# Patient Record
Sex: Male | Born: 1950 | Race: White | Hispanic: No | Marital: Married | Smoking: Former smoker
Health system: Southern US, Community
[De-identification: ages and names within clinical notes are randomized; demographics above are authoritative.]

## PROBLEM LIST (undated history)

## (undated) DIAGNOSIS — I2510 Atherosclerotic heart disease of native coronary artery without angina pectoris: Secondary | ICD-10-CM

## (undated) DIAGNOSIS — Z720 Tobacco use: Secondary | ICD-10-CM

## (undated) DIAGNOSIS — I213 ST elevation (STEMI) myocardial infarction of unspecified site: Secondary | ICD-10-CM

## (undated) DIAGNOSIS — E785 Hyperlipidemia, unspecified: Secondary | ICD-10-CM

## (undated) DIAGNOSIS — I10 Essential (primary) hypertension: Secondary | ICD-10-CM

---

## 2013-08-01 HISTORY — PX: CORONARY ANGIOPLASTY WITH STENT PLACEMENT: SHX49

## 2013-08-02 ENCOUNTER — Encounter (HOSPITAL_COMMUNITY)
Admission: EM | Disposition: A | Discharge status: Home / Self Care | Payer: BC Managed Care – PPO | Source: Home / Self Care | Attending: Cardiovascular Disease

## 2013-08-02 ENCOUNTER — Inpatient Hospital Stay (HOSPITAL_COMMUNITY)
Admission: EM | Admit: 2013-08-02 | Discharge: 2013-08-04 | DRG: 247 | Disposition: A | Discharge status: Home / Self Care | Payer: BC Managed Care – PPO | Attending: Cardiovascular Disease | Admitting: Cardiovascular Disease

## 2013-08-02 ENCOUNTER — Ambulatory Visit (HOSPITAL_COMMUNITY): Admit: 2013-08-02 | Payer: Self-pay | Admitting: Cardiovascular Disease

## 2013-08-02 DIAGNOSIS — I4589 Other specified conduction disorders: Secondary | ICD-10-CM | POA: Diagnosis not present

## 2013-08-02 DIAGNOSIS — I2510 Atherosclerotic heart disease of native coronary artery without angina pectoris: Secondary | ICD-10-CM | POA: Diagnosis present

## 2013-08-02 DIAGNOSIS — I213 ST elevation (STEMI) myocardial infarction of unspecified site: Secondary | ICD-10-CM

## 2013-08-02 DIAGNOSIS — I498 Other specified cardiac arrhythmias: Secondary | ICD-10-CM | POA: Diagnosis not present

## 2013-08-02 DIAGNOSIS — F17200 Nicotine dependence, unspecified, uncomplicated: Secondary | ICD-10-CM | POA: Diagnosis present

## 2013-08-02 DIAGNOSIS — Z720 Tobacco use: Secondary | ICD-10-CM | POA: Diagnosis present

## 2013-08-02 DIAGNOSIS — I440 Atrioventricular block, first degree: Secondary | ICD-10-CM | POA: Diagnosis not present

## 2013-08-02 DIAGNOSIS — I4949 Other premature depolarization: Secondary | ICD-10-CM | POA: Diagnosis not present

## 2013-08-02 DIAGNOSIS — I2119 ST elevation (STEMI) myocardial infarction involving other coronary artery of inferior wall: Principal | ICD-10-CM | POA: Diagnosis present

## 2013-08-02 DIAGNOSIS — E785 Hyperlipidemia, unspecified: Secondary | ICD-10-CM | POA: Diagnosis present

## 2013-08-02 HISTORY — DX: ST elevation (STEMI) myocardial infarction of unspecified site: I21.3

## 2013-08-02 HISTORY — PX: PERCUTANEOUS CORONARY STENT INTERVENTION (PCI-S): SHX5485

## 2013-08-02 HISTORY — DX: Hyperlipidemia, unspecified: E78.5

## 2013-08-02 HISTORY — DX: Tobacco use: Z72.0

## 2013-08-02 HISTORY — DX: Atherosclerotic heart disease of native coronary artery without angina pectoris: I25.10

## 2013-08-02 HISTORY — PX: LEFT HEART CATHETERIZATION WITH CORONARY ANGIOGRAM: SHX5451

## 2013-08-02 LAB — CBC
HEMATOCRIT: 39.5 % (ref 39.0–52.0)
Hemoglobin: 14 g/dL (ref 13.0–17.0)
MCH: 32.2 pg (ref 26.0–34.0)
MCHC: 35.4 g/dL (ref 30.0–36.0)
MCV: 90.8 fL (ref 78.0–100.0)
Platelets: 206 10*3/uL (ref 150–400)
RBC: 4.35 MIL/uL (ref 4.22–5.81)
RDW: 13.3 % (ref 11.5–15.5)
WBC: 12.3 10*3/uL — AB (ref 4.0–10.5)

## 2013-08-02 LAB — COMPREHENSIVE METABOLIC PANEL
ALT: 17 U/L (ref 0–53)
AST: 25 U/L (ref 0–37)
Albumin: 3.7 g/dL (ref 3.5–5.2)
Alkaline Phosphatase: 59 U/L (ref 39–117)
BILIRUBIN TOTAL: 0.3 mg/dL (ref 0.3–1.2)
BUN: 11 mg/dL (ref 6–23)
CHLORIDE: 100 meq/L (ref 96–112)
CO2: 26 mEq/L (ref 19–32)
CREATININE: 0.84 mg/dL (ref 0.50–1.35)
Calcium: 9.1 mg/dL (ref 8.4–10.5)
GFR calc non Af Amer: >90 mL/min (ref >90)
Glucose, Bld: 127 mg/dL — ABNORMAL HIGH (ref 70–99)
Potassium: 3.9 mEq/L (ref 3.7–5.3)
Sodium: 137 mEq/L (ref 137–147)
Total Protein: 6.8 g/dL (ref 6.0–8.3)

## 2013-08-02 LAB — APTT: aPTT: 28 seconds (ref 24–37)

## 2013-08-02 LAB — PROTIME-INR
INR: 1.04 (ref 0.00–1.49)
Prothrombin Time: 13.4 seconds (ref 11.6–15.2)

## 2013-08-02 LAB — I-STAT TROPONIN, ED: TROPONIN I, POC: 0 ng/mL (ref 0.00–0.08)

## 2013-08-02 SURGERY — LEFT HEART CATHETERIZATION WITH CORONARY ANGIOGRAM
Anesthesia: LOCAL

## 2013-08-02 MED ORDER — MIDAZOLAM HCL 2 MG/2ML IJ SOLN
INTRAMUSCULAR | Status: AC
  Filled 2013-08-02: qty 2

## 2013-08-02 MED ORDER — NITROGLYCERIN 0.2 MG/ML ON CALL CATH LAB
INTRAVENOUS | Status: AC
  Filled 2013-08-02: qty 1

## 2013-08-02 MED ORDER — TICAGRELOR 90 MG PO TABS
ORAL_TABLET | ORAL | Status: AC
  Administered 2013-08-03: 90 mg via ORAL
  Filled 2013-08-02: qty 1

## 2013-08-02 MED ORDER — LIDOCAINE HCL (PF) 1 % IJ SOLN
INTRAMUSCULAR | Status: AC
  Filled 2013-08-02: qty 30

## 2013-08-02 MED ORDER — FENTANYL CITRATE 0.05 MG/ML IJ SOLN
INTRAMUSCULAR | Status: AC
  Filled 2013-08-02: qty 2

## 2013-08-02 MED ORDER — TICAGRELOR 90 MG PO TABS
ORAL_TABLET | ORAL | Status: AC
  Filled 2013-08-02: qty 1

## 2013-08-02 MED ORDER — HEPARIN SODIUM (PORCINE) 5000 UNIT/ML IJ SOLN: 60.0000 [IU]/kg | INTRAMUSCULAR | Status: DC

## 2013-08-02 MED ORDER — BIVALIRUDIN 250 MG IV SOLR
INTRAVENOUS | Status: AC
  Filled 2013-08-02: qty 250

## 2013-08-02 MED ORDER — HEPARIN (PORCINE) IN NACL 2-0.9 UNIT/ML-% IJ SOLN
INTRAMUSCULAR | Status: AC
  Filled 2013-08-02: qty 1500

## 2013-08-02 NOTE — ED Notes (Signed)
Sudden onset of chest pain approx 8:30pm tonight while watching television.  On arrival pt rates chest pain #4 on pain scale 0/10  Pt was given ASA 4 and NTG SL 3 PTA

## 2013-08-02 NOTE — ED Provider Notes (Signed)
CSN: [id number]     Arrival date & time 08/02/13  2225 History   First MD Initiated Contact with Patient 08/02/13 2230     Chief Complaint  Patient presents with  . Code STEMI     (Consider location/radiation/quality/duration/timing/severity/associated sxs/prior Treatment) Patient is a 63 y.o. male presenting with chest pain. The history is provided by the patient.  Chest Pain Pain location:  Substernal area Pain quality: sharp   Pain radiates to:  Does not radiate Pain radiates to the back: no   Pain severity:  Moderate Onset quality:  Sudden Duration:  2 hours Timing:  Constant Progression:  Partially resolved Chronicity:  New Context: at rest   Relieved by:  Nitroglycerin and aspirin Worsened by:  Nothing tried Ineffective treatments:  None tried Associated symptoms: no abdominal pain, no cough, no fever, no lower extremity edema, no shortness of breath, no syncope and not vomiting     No past medical history on file. No past surgical history on file. No family history on file. History  Substance Use Topics  . Smoking status: Not on file  . Smokeless tobacco: Not on file  . Alcohol Use: Not on file    Review of Systems  Unable to perform ROS: Acuity of condition  Constitutional: Negative for fever.  Respiratory: Negative for cough and shortness of breath.   Cardiovascular: Positive for chest pain. Negative for syncope.  Gastrointestinal: Negative for vomiting and abdominal pain.      Allergies  Review of patient's allergies indicates not on file.  Home Medications   Prior to Admission medications   Not on File   BP 142/90  Pulse 80 Physical Exam  Constitutional: He is oriented to person, place, and time. He appears well-developed and well-nourished. No distress.  HENT:  Head: Normocephalic and atraumatic.  Eyes: Conjunctivae are normal.  Neck: Neck supple. No tracheal deviation present.  Cardiovascular: Normal rate, regular rhythm and normal heart  sounds.   Pulmonary/Chest: Effort normal. No respiratory distress.  Abdominal: Soft. He exhibits no distension.  Neurological: He is alert and oriented to person, place, and time.  Skin: Skin is warm and dry.  Psychiatric: He has a normal mood and affect.    ED Course  Procedures (including critical care time) Labs Review Labs Reviewed  CBC - Abnormal; Notable for the following:    WBC 12.3 (*)    All other components within normal limits  COMPREHENSIVE METABOLIC PANEL - Abnormal; Notable for the following:    Glucose, Bld 127 (*)    All other components within normal limits  APTT  PROTIME-INR  I-STAT TROPOININ, ED    Imaging Review No results found.   EKG Interpretation   Date/Time:  Tuesday August 02 2013 22:31:23 EDT Ventricular Rate:  72 PR Interval:  153 QRS Duration: 76 QT Interval:  399 QTC Calculation: 437 R Axis:   40 Text Interpretation:  Sinus rhythm Ventricular premature complex Inferior  infarct, acute (RCA) Minimal ST elevation, anterior leads Lateral leads  are also involved Probable RV involvement, suggest recording right  precordial leads Confirmed by ALLEN  MD, ANTHONY ([id number]) on 08/02/2013  10:36:05 PM      MDM   Final diagnoses:  STEMI (ST elevation myocardial infarction)   63 y.o. male presents with sudden onset sternal sharp chest pain that started 2 hours prior to arrival. He called EMS and was found to have an EKG with inferior STEMI pattern and lateral reciprocal changes apparent.  Patient received 325 mg  of aspirin and several rounds of nitroglycerin over a long transport time prior to arrival. On arrival our EKG shows redo menstruation of STEMI pattern, cardiology available at bedside for evaluation, hemodynamically stable for transport, patient rushed to Cath Lab for emergent catheterization in setting of acute ST elevation MI.    Lyndal Pulley, MD 08/02/13 2350

## 2013-08-02 NOTE — ED Notes (Signed)
Cardiologist at bedside.   Explaining procedure of cardiac cath to pt.  Pt transported to cath lab

## 2013-08-02 NOTE — ED Provider Notes (Signed)
I saw and evaluated the patient, reviewed the resident's note and I agree with the findings and plan.   EKG Interpretation   Date/Time:  Tuesday August 02 2013 22:31:23 EDT Ventricular Rate:  72 PR Interval:  153 QRS Duration: 76 QT Interval:  399 QTC Calculation: 437 R Axis:   40 Text Interpretation:  Sinus rhythm Ventricular premature complex Inferior  infarct, acute (RCA) Minimal ST elevation, anterior leads Lateral leads  are also involved Probable RV involvement, suggest recording right  precordial leads Confirmed by Bilal Manzer  MD, Sue Fernicola ([id number]) on 08/02/2013  10:36:05 PM     Patient here complaining of chest pain that began 2 hours prior to arrival. EKG was transported to me which shows an inferior wall STEMI. Code activated prior to arrival. Awaiting cardiology evaluation  10:36 PM Patient's EKG confirms acute inferior wall myocardial infarction. Cardiology at bedside. Patient to the Cath Lab  Toy Baker, MD 08/02/13 2237

## 2013-08-03 ENCOUNTER — Encounter (HOSPITAL_COMMUNITY): Payer: Self-pay | Admitting: Internal Medicine

## 2013-08-03 DIAGNOSIS — I213 ST elevation (STEMI) myocardial infarction of unspecified site: Secondary | ICD-10-CM | POA: Diagnosis present

## 2013-08-03 DIAGNOSIS — I2119 ST elevation (STEMI) myocardial infarction involving other coronary artery of inferior wall: Secondary | ICD-10-CM | POA: Diagnosis present

## 2013-08-03 DIAGNOSIS — I219 Acute myocardial infarction, unspecified: Secondary | ICD-10-CM

## 2013-08-03 LAB — HEPATIC FUNCTION PANEL
ALBUMIN: 3.7 g/dL (ref 3.5–5.2)
ALT: 22 U/L (ref 0–53)
AST: 57 U/L — ABNORMAL HIGH (ref 0–37)
Alkaline Phosphatase: 64 U/L (ref 39–117)
BILIRUBIN TOTAL: 0.3 mg/dL (ref 0.3–1.2)
Bilirubin, Direct: <0.2 mg/dL (ref 0.0–0.3)
Total Protein: 6.7 g/dL (ref 6.0–8.3)

## 2013-08-03 LAB — LIPID PANEL
CHOL/HDL RATIO: 4.2 ratio
CHOLESTEROL: 175 mg/dL (ref 0–200)
HDL: 42 mg/dL (ref >39)
LDL Cholesterol: 103 mg/dL — ABNORMAL HIGH (ref 0–99)
TRIGLYCERIDES: 152 mg/dL — AB (ref <150)
VLDL: 30 mg/dL (ref 0–40)

## 2013-08-03 LAB — POCT I-STAT, CHEM 8
BUN: 12 mg/dL (ref 6–23)
CREATININE: 0.7 mg/dL (ref 0.50–1.35)
Calcium, Ion: 1.2 mmol/L (ref 1.13–1.30)
Chloride: 91 mEq/L — ABNORMAL LOW (ref 96–112)
GLUCOSE: 98 mg/dL (ref 70–99)
HCT: 39 % (ref 39.0–52.0)
Hemoglobin: 13.3 g/dL (ref 13.0–17.0)
Potassium: 3.5 mEq/L — ABNORMAL LOW (ref 3.7–5.3)
SODIUM: 128 meq/L — AB (ref 137–147)
TCO2: 23 mmol/L (ref 0–100)

## 2013-08-03 LAB — CBC
HCT: 39.6 % (ref 39.0–52.0)
HEMOGLOBIN: 14.2 g/dL (ref 13.0–17.0)
MCH: 32.6 pg (ref 26.0–34.0)
MCHC: 35.9 g/dL (ref 30.0–36.0)
MCV: 91 fL (ref 78.0–100.0)
Platelets: 174 10*3/uL (ref 150–400)
RBC: 4.35 MIL/uL (ref 4.22–5.81)
RDW: 13.4 % (ref 11.5–15.5)
WBC: 13 10*3/uL — ABNORMAL HIGH (ref 4.0–10.5)

## 2013-08-03 LAB — POCT ACTIVATED CLOTTING TIME
Activated Clotting Time: 332 seconds
Activated Clotting Time: 105 seconds

## 2013-08-03 LAB — BASIC METABOLIC PANEL
BUN: 11 mg/dL (ref 6–23)
CHLORIDE: 100 meq/L (ref 96–112)
CO2: 21 mEq/L (ref 19–32)
Calcium: 8.8 mg/dL (ref 8.4–10.5)
Creatinine, Ser: 0.73 mg/dL (ref 0.50–1.35)
GFR calc Af Amer: >90 mL/min (ref >90)
GFR calc non Af Amer: >90 mL/min (ref >90)
GLUCOSE: 98 mg/dL (ref 70–99)
POTASSIUM: 4.1 meq/L (ref 3.7–5.3)
SODIUM: 134 meq/L — AB (ref 137–147)

## 2013-08-03 LAB — TROPONIN I: Troponin I: 6.74 ng/mL (ref <0.30)

## 2013-08-03 LAB — MRSA PCR SCREENING: MRSA BY PCR: NEGATIVE

## 2013-08-03 LAB — PROTIME-INR
INR: 1.72 — ABNORMAL HIGH (ref 0.00–1.49)
Prothrombin Time: 19.7 seconds — ABNORMAL HIGH (ref 11.6–15.2)

## 2013-08-03 LAB — HEMOGLOBIN A1C
HEMOGLOBIN A1C: 5.5 % (ref <5.7)
MEAN PLASMA GLUCOSE: 111 mg/dL (ref <117)

## 2013-08-03 MED ORDER — ALUM & MAG HYDROXIDE-SIMETH 200-200-20 MG/5ML PO SUSP: 30.0000 mL | ORAL | Status: DC | PRN

## 2013-08-03 MED ORDER — METOPROLOL TARTRATE 12.5 MG HALF TABLET
12.5000 mg | ORAL_TABLET | Freq: Two times a day (BID) | ORAL | Status: DC
  Administered 2013-08-03 – 2013-08-04 (×3): 12.5 mg via ORAL
  Filled 2013-08-03 (×4): qty 1

## 2013-08-03 MED ORDER — TICAGRELOR 90 MG PO TABS
90.0000 mg | ORAL_TABLET | Freq: Two times a day (BID) | ORAL | Status: DC
  Administered 2013-08-03 – 2013-08-04 (×3): 90 mg via ORAL
  Filled 2013-08-03 (×4): qty 1

## 2013-08-03 MED ORDER — PRAMOXINE-ZINC OXIDE IN MO 1-12.5 % RE OINT
1.0000 "application " | TOPICAL_OINTMENT | Freq: Three times a day (TID) | RECTAL | Status: DC | PRN
  Filled 2013-08-03: qty 28.3

## 2013-08-03 MED ORDER — ENOXAPARIN SODIUM 40 MG/0.4ML ~~LOC~~ SOLN
40.0000 mg | SUBCUTANEOUS | Status: DC
  Administered 2013-08-03 – 2013-08-04 (×2): 40 mg via SUBCUTANEOUS
  Filled 2013-08-03 (×2): qty 0.4

## 2013-08-03 MED ORDER — NITROGLYCERIN 0.4 MG SL SUBL: 0.4000 mg | SUBLINGUAL_TABLET | SUBLINGUAL | Status: DC | PRN

## 2013-08-03 MED ORDER — NITROGLYCERIN IN D5W 200-5 MCG/ML-% IV SOLN
2.0000 ug/min | INTRAVENOUS | Status: DC
  Administered 2013-08-03: 10 ug/min via INTRAVENOUS

## 2013-08-03 MED ORDER — ASPIRIN EC 81 MG PO TBEC
81.0000 mg | DELAYED_RELEASE_TABLET | Freq: Every day | ORAL | Status: DC
  Administered 2013-08-03 – 2013-08-04 (×2): 81 mg via ORAL
  Filled 2013-08-03 (×2): qty 1

## 2013-08-03 MED ORDER — ATROPINE SULFATE 0.1 MG/ML IJ SOLN
INTRAMUSCULAR | Status: AC
  Filled 2013-08-03: qty 10

## 2013-08-03 MED ORDER — ATORVASTATIN CALCIUM 80 MG PO TABS
80.0000 mg | ORAL_TABLET | Freq: Every day | ORAL | Status: DC
  Administered 2013-08-03: 80 mg via ORAL
  Filled 2013-08-03 (×2): qty 1

## 2013-08-03 MED ORDER — MAGNESIUM HYDROXIDE 400 MG/5ML PO SUSP: 30.0000 mL | Freq: Every day | ORAL | Status: DC | PRN

## 2013-08-03 MED ORDER — GUAIFENESIN-DM 100-10 MG/5ML PO SYRP: 15.0000 mL | ORAL_SOLUTION | ORAL | Status: DC | PRN

## 2013-08-03 MED ORDER — PNEUMOCOCCAL VAC POLYVALENT 25 MCG/0.5ML IJ INJ
0.5000 mL | INJECTION | INTRAMUSCULAR | Status: AC
  Administered 2013-08-04: 0.5 mL via INTRAMUSCULAR
  Filled 2013-08-03: qty 0.5

## 2013-08-03 MED ORDER — SODIUM CHLORIDE 0.9 % IV SOLN
INTRAVENOUS | Status: DC
  Administered 2013-08-03: 01:00:00 via INTRAVENOUS

## 2013-08-03 MED ORDER — SODIUM CHLORIDE 0.9 % IV SOLN
0.2500 mg/kg/h | INTRAVENOUS | Status: AC
  Administered 2013-08-03: 0.25 mg/kg/h via INTRAVENOUS
  Filled 2013-08-03: qty 250

## 2013-08-03 NOTE — H&P (Signed)
   PCP:   No primary provider on file.   Chief Complaint:  Chest pain  HPI: This is 63 year old Caucasian male with past medical history of tobacco abuse and no significant past medical or past surgical history who presented with substernal pressure type chest pain, nonradiating associate with shortness of breath that started at 7:30-8 PM on 08/02/2013. Patient called EMS and code STEMI was activated upon the initial EKG was reviewed. Patient is taken emergently to the Cath Lab and received 2 stents to proximal and distal RCA  Patient denied any history of melena, bright red blood per rectum, hematuria, peptic ulcer disease  Review of Systems:  12 systems were reviewed and were negative except mentioned in the history of present illness  Past Medical History: Patient doesn't have any past medical or past surgical history. Never been to a doctor in his life  Medications: Not on regular home medications  Allergies: No known drug allergies  Social History: Patient denied alcohol drug use. Reported smoking one pack a day since age of 28  Family History: No family history of early cardiac or disease  PHYSICAL EXAM:  Filed Vitals:   08/02/13 2237  BP: 142/90  Pulse: 80   General:  Well appearing. No respiratory difficulty HEENT: normal Neck: supple. no JVD. Carotids 2+ bilat; no bruits. No lymphadenopathy or thryomegaly appreciated. Cor: PMI nondisplaced. Regular rate & rhythm. No rubs, gallops or murmurs. Lungs: Bilateral scattered wheezing is, coarse breath sounds Abdomen: soft, nontender, nondistended. No hepatosplenomegaly. No bruits or masses. Good bowel sounds. Extremities: no cyanosis, clubbing, rash, edema Neuro: alert & oriented x 3, cranial nerves grossly intact. moves all 4 extremities w/o difficulty. Affect pleasant.  Labs on Admission:   Recent Labs  08/02/13 2230  NA 137  K 3.9  CL 100  CO2 26  GLUCOSE 127*  BUN 11  CREATININE 0.84  CALCIUM 9.1     Recent Labs  08/02/13 2230  AST 25  ALT 17  ALKPHOS 59  BILITOT 0.3  PROT 6.8  ALBUMIN 3.7   No results found for this basename: LIPASE, AMYLASE,  in the last 72 hours  Recent Labs  08/02/13 2230  WBC 12.3*  HGB 14.0  HCT 39.5  MCV 90.8  PLT 206   EKG personally reviewed and interpreted by me: 08/02/2013 obtaining the ED Normal sinus rhythm, inferior STEMI  Assessment/Plan  Inferior STEMI Status post proximal and distal RCA stent. See Dr. Landry Dyke catheterization report for more details - Echo in the morning - Continue with aspirin and Brilinta - Lipitor 80 mg daily - We'll hold on ACE inhibitor and beta blocker until ensure the patient is hemodynamically stable  Tobacco abuse Nicotine patch 21 mg daily  Nolon Nations 08/03/2013, 12:04 AM

## 2013-08-03 NOTE — Progress Notes (Signed)
CARDIAC REHAB PHASE I   PRE:  Rate/Rhythm: 67 SR  BP:  Supine:   Sitting: 157/84  Standing:    SaO2: 100 RA  MODE:  Ambulation: 700 ft   POST:  Rate/Rhythm: 76 Sr  BP:  Supine:   Sitting: 163/83  Standing:    SaO2: 100 RA 1345-1510 Pt tolerated ambulation well without c/o of cp or SOB. . Pt to recliner after walk with call light in reach and wife present. Completed MI and stent education. Discussed smoking cessation with pt. I gave him tips for quitting, coaching contact number and quit smart class information. Pt declines Outpt. CRP due to his work hours. He wants to quit smoking and seems committed to quit. His wife smokes and states that she will go outside to smoke  Melina Copa RN 08/03/2013 3:13 PM

## 2013-08-03 NOTE — CV Procedure (Signed)
Mark Williams is a 63 y.o. male    [id number]  [id number] LOCATION:  FACILITY: [facility]  PHYSICIAN: Lennette Bihari, MD, [facility] 1951-01-25   DATE OF PROCEDURE:  08/03/2013    EMERGENT CARDIAC CATHETERIZATION/PERCUTANEOUS CORONARY INTERVENTION     HISTORY:    Mark Williams is a 63 y.o. male   PROCEDURE: Emergent left heart catheterization: Coronary angiography, left ventriculography, PCI with PTCA/stenting of the RCA distally and proximally.  The patient was brought to the  [facility] Cardiac cath lab by [facility] and had 7/10 chest pain in the setting of an inferior STEMI.  He was premedicated with 2 mg and fentanyl 50 mcg. His right groin was prepped and shaved in usual sterile fashion. Xylocaine 1% was used for local anesthesia. A 5 French sheath was inserted into the R femoral artery. Diagnostic catheterizatiion was done with 5 [place] LF4, and a FR4 guide catheter.  With the demonstration of total distal RCA occlusion, intervention was performed.  There was difficulty with guide backup support. Angiomax bolus plus infusion and Brilinta 180 mg were administered.  There was difficulty in this intervention secondary to catheter backup, which contributed to some delay in DBT efficiency.  Initially, an Asahi medium wire was advanced into the RCA.  Due to catheter backup difficulties the wire was never able to reach the lesion.  A 2.0x12 mm Euphora balloon was inserted for additional support.  Ultimately, the entire system pulled back. A luge wire was then inserted again and backup remained difficult.   The entire system was then removed and a 3 DRC guide 6 [place] was inserted, which did provide additional support.  A Choice PT wire was advanced and with support of the Euphora balloon was able to cross the total distal stenosis.  This did result in reperfusion arrhythmia with idioventricular rhythm as well as bradycardia.  His heart rate dropped into the 30s.  Atropine 1 mg was administered.  Dilatation at  the site of distal occlusion was done up to 9 atmospheres with a 2.0x12 mm balloon.  A Xience Alpine 2.5x15 mm DES stent was then successfully deployed distally at 13 and 14 atmospheres.  At this point, the proximal stenosis had narrowed and there was a concern, perhaps, that this may be wire bias versus spasm.  There was no improvement with IC nitroglycerin administration.  The stenosis proximally had increased to at least 80%.  A Xiience 3.0x15 mm DES stent was then inserted proximally and dilated at 13 and 14 atmospheres.  An [place] Euphora 3.25x12 mm balloon was used fro post-stent dilatation that both stents.  Distally, the 2.5 mm stent was postdilated to 2.97 mm and proximally the 3.0 mm stent was post dilated to 3.21 mm.  Scout angiography confirmed an excellent angiographic result.  A 5 French pigtail catheter was then inserted and left ventriculography was performed.  The arterial sheath was sutured in place.  Plans are continued bivalirudin for additional 4 hours in the setting of this ST segment elevation myocardial infarction.  He left the catheterization laboratory and was transported to the coronary care unit with stable hemodynamics and chest pain-free.   HEMODYNAMICS:   Central Aorta: 100/73   Left Ventricle: 100/16  ANGIOGRAPHY:  Left main: Angiographically normal and trifurcated into the LAD, a high marginal/ramus intermediate vessel, and the left circumflex coronary artery   LAD: Moderate size vessel with 20% proximal narrowing before the first septal perforator artery.  There was irregularity with 30% narrowings in the proximal first  diagonal branch.  The LAD gave rise to an additional diagonal vessel and several septal perforator arteries and extended to the LV apex.  Ramus Intermediate: 70% eccentric proximal stenosis.  Left circumflex: Angiographically normal vessel, which supplied a marginal vessel and ended in a posterolateral like vessel.   Right coronary artery: Moderate  size vessel that was totally occluded distally with TIMI 0 flow.  Following percutaneous coronary intervention, the 100% distal occlusion was ultimately stented with a 2.5x15 mm on its DES stent, postdilated to 2.9, 7 mm in the 100% occlusion was reduced to 0%.  The vessel beyond the initial occlusion was large caliber PDA-like vessel, supplies entire posterior wall.  There was mild luminal narrowing of the proximal RCA.  The door.  The procedure.  This significantly progressed.  Initially, there was concern that this may be wire bias or spasm.  However, this did not improve, and, consequently was stented with a 3.0x16 mm science stent, postdilated to 3.2, 1 mm with the lesion being reduced to 0%.  Left ventriculography revealed low normal global LV function with an ejection fraction of 50-55%.  There was mild to moderate mid inferior hypocontractility.    IMPRESSION:  Acute inferior wall ST segment elevation myocardial infarction secondary to total distal occlusion of the right coronary artery  20% proximal LAD stenosis and 30% irregularities of the diagonal vessel  70% eccentric stenosis in the proximal ramus intermediate/high marginal vessel  Distal RCA occlusion successfully treated with PTCA/stenting and ultimate insertion of a Xience 2.5x15 mm DES stent, postdilated to 2.9, 7 mm and stenting of the proximal RCA with a 3.0x15 mm Xience stent, postdilated 3.2, 1 mm, with residual narrowings being 0%.  RECOMMENDATION:  The patient will be maintained on dual antiplatelet therapy for minimum of a year.  Plan initial medical therapy for his mild concomitant mild/moderate concomitant CAD.  Smoking cessation is imperative.    Lennette Biharihomas A. Aidyn Kellis, MD, [facility] 08/03/2013 12:25 AM

## 2013-08-03 NOTE — Progress Notes (Signed)
Patient Name: Mark HildingJohn Williams Date of Encounter: 08/03/2013  Active Problems:   STEMI (ST elevation myocardial infarction)   ST elevation myocardial infarction (STEMI) of inferior wall   Length of Stay: 1  SUBJECTIVE  Feels completely back to normal. Troponin 6 ECG with small inferior Q waves EF 50-55%, mild inferior wall hypo Groin site looks good  CURRENT MEDS . aspirin EC  81 mg Oral Daily  . atorvastatin  80 mg Oral q1800  . enoxaparin (LOVENOX) injection  40 mg Subcutaneous Q24H  . metoprolol tartrate  12.5 mg Oral BID  . [START ON 08/04/2013] pneumococcal 23 valent vaccine  0.5 mL Intramuscular Tomorrow-1000  . ticagrelor  90 mg Oral BID    OBJECTIVE   Intake/Output Summary (Last 24 hours) at 08/03/13 0840 Last data filed at 08/03/13 0743  Gross per 24 hour  Intake 930.15 ml  Output   1100 ml  Net -169.85 ml   Filed Weights   08/02/13 2300 08/03/13 0300  Weight: 146 lb 2.6 oz (66.3 kg) 146 lb 2.6 oz (66.3 kg)    PHYSICAL EXAM Filed Vitals:   08/03/13 0700 08/03/13 0715 08/03/13 0730 08/03/13 0745  BP: 147/90 132/86 126/77 129/82  Pulse: 77 71 67 68  Temp:   98.3 F (36.8 C)   TempSrc:   Oral   Resp: 19 18 17 17   Height:      Weight:      SpO2: 97% 100% 97% 99%   General: Alert, oriented x3, no distress Head: no evidence of trauma, PERRL, EOMI, no exophtalmos or lid lag, no myxedema, no xanthelasma; normal ears, nose and oropharynx Neck: normal jugular venous pulsations and no hepatojugular reflux; brisk carotid pulses without delay and no carotid bruits Chest: clear to auscultation, no signs of consolidation by percussion or palpation, normal fremitus, symmetrical and full respiratory excursions Cardiovascular: normal position and quality of the apical impulse, regular rhythm, normal first and second heart sounds, no rubs or gallops, no murmur Abdomen: no tenderness or distention, no masses by palpation, no abnormal pulsatility or arterial bruits, normal bowel  sounds, no hepatosplenomegaly Extremities: no clubbing, cyanosis or edema; 2+ radial, ulnar and brachial pulses bilaterally; 2+ right femoral, posterior tibial and dorsalis pedis pulses; 2+ left femoral, posterior tibial and dorsalis pedis pulses; no subclavian or femoral bruits No right groin hematoma/ecchymosis Neurological: grossly nonfocal  LABS  CBC  Recent Labs  08/02/13 2230 08/03/13 0123  WBC 12.3* 13.0*  HGB 14.0 14.2  HCT 39.5 39.6  MCV 90.8 91.0  PLT 206 174   Basic Metabolic Panel  Recent Labs  08/02/13 2230 08/03/13 0123  NA 137 134*  K 3.9 4.1  CL 100 100  CO2 26 21  GLUCOSE 127* 98  BUN 11 11  CREATININE 0.84 0.73  CALCIUM 9.1 8.8   Liver Function Tests  Recent Labs  08/02/13 2230 08/03/13 0123  AST 25 57*  ALT 17 22  ALKPHOS 59 64  BILITOT 0.3 0.3  PROT 6.8 6.7  ALBUMIN 3.7 3.7   No results found for this basename: LIPASE, AMYLASE,  in the last 72 hours Cardiac Enzymes  Recent Labs  08/03/13 0123  TROPONINI 6.74*  Fasting Lipid Panel  Recent Labs  08/03/13 0123  CHOL 175  HDL 42  LDLCALC 103*  TRIG 152*  CHOLHDL 4.2   Radiology Studies No results found.  TELE NSR  ECG NSR, inferior Q waves, minimal residual ST change  ASSESSMENT AND PLAN S/P inferior wall STEMI with successful  early PCI with DES, uncomplicted. Bed rest up at 11 AM, can probably go to telemetry afterwards, possibly even DC tomorrow. Discussed critical importance of DAPT, smoking cessation.   [name], MD, [facility] [phone] office [phone] pager 08/03/2013 8:40 AM

## 2013-08-03 NOTE — Progress Notes (Signed)
Cardiac Cath Lab Sheath Removal Note  Site Area: Right Femoral  Site Prior to Removal: Level 0 Pressure Applied For: 20 Minutes Beginning at [phone] Patient Status During Sheath Pull: Pt. Denied pain, VS remained stable  Post Sheath Removal Groin Site: Level 0 Post Cath Instructions Given: Yes Post Pulses Present: Yes Pressure Dressing Applied: Yes Ignacia Palma RN

## 2013-08-04 ENCOUNTER — Inpatient Hospital Stay (HOSPITAL_COMMUNITY): Payer: BC Managed Care – PPO

## 2013-08-04 ENCOUNTER — Encounter (HOSPITAL_COMMUNITY): Payer: Self-pay | Admitting: Cardiology

## 2013-08-04 DIAGNOSIS — I2510 Atherosclerotic heart disease of native coronary artery without angina pectoris: Secondary | ICD-10-CM | POA: Diagnosis present

## 2013-08-04 DIAGNOSIS — I2119 ST elevation (STEMI) myocardial infarction involving other coronary artery of inferior wall: Secondary | ICD-10-CM

## 2013-08-04 DIAGNOSIS — Z720 Tobacco use: Secondary | ICD-10-CM

## 2013-08-04 DIAGNOSIS — E785 Hyperlipidemia, unspecified: Secondary | ICD-10-CM | POA: Diagnosis present

## 2013-08-04 HISTORY — DX: Atherosclerotic heart disease of native coronary artery without angina pectoris: I25.10

## 2013-08-04 HISTORY — DX: Hyperlipidemia, unspecified: E78.5

## 2013-08-04 HISTORY — DX: Tobacco use: Z72.0

## 2013-08-04 MED ORDER — ASPIRIN 81 MG PO TBEC: 81.0000 mg | DELAYED_RELEASE_TABLET | Freq: Every day | ORAL | Status: AC

## 2013-08-04 MED ORDER — METOPROLOL TARTRATE 25 MG PO TABS: 12.5000 mg | ORAL_TABLET | Freq: Two times a day (BID) | ORAL | Status: DC

## 2013-08-04 MED ORDER — TICAGRELOR 90 MG PO TABS: 90.0000 mg | ORAL_TABLET | Freq: Two times a day (BID) | ORAL | Status: DC

## 2013-08-04 MED ORDER — ATORVASTATIN CALCIUM 80 MG PO TABS: 80.0000 mg | ORAL_TABLET | Freq: Every day | ORAL | Status: DC

## 2013-08-04 MED ORDER — NITROGLYCERIN 0.4 MG SL SUBL: 0.4000 mg | SUBLINGUAL_TABLET | SUBLINGUAL | Status: DC | PRN

## 2013-08-04 MED FILL — Sodium Chloride IV Soln 0.9%: INTRAVENOUS | Qty: 50 | Status: AC

## 2013-08-04 NOTE — Progress Notes (Signed)
Patient Name: Mark Williams Date of Encounter: 08/04/2013  Active Problems:   STEMI (ST elevation myocardial infarction)   ST elevation myocardial infarction (STEMI) of inferior wall   Length of Stay: 2  SUBJECTIVE  Asymptomatic, walked several laps.  CURRENT MEDS . aspirin EC  81 mg Oral Daily  . atorvastatin  80 mg Oral q1800  . enoxaparin (LOVENOX) injection  40 mg Subcutaneous Q24H  . metoprolol tartrate  12.5 mg Oral BID  . pneumococcal 23 valent vaccine  0.5 mL Intramuscular Tomorrow-1000  . ticagrelor  90 mg Oral BID    OBJECTIVE   Intake/Output Summary (Last 24 hours) at 08/04/13 0835 Last data filed at 08/04/13 0800  Gross per 24 hour  Intake 2762.7 ml  Output   1501 ml  Net 1261.7 ml   Filed Weights   08/02/13 2300 08/03/13 0300 08/04/13 0500  Weight: 146 lb 2.6 oz (66.3 kg) 146 lb 2.6 oz (66.3 kg) 147 lb 4.3 oz (66.8 kg)    PHYSICAL EXAM Filed Vitals:   08/04/13 0400 08/04/13 0500 08/04/13 0600 08/04/13 0700  BP: 117/74 132/78 128/82 102/67  Pulse: 64 58 56 58  Temp:    97.9 F (36.6 C)  TempSrc:    Oral  Resp: 13 13 15 14   Height:      Weight:  147 lb 4.3 oz (66.8 kg)    SpO2: 96% 98% 96% 96%   General: Alert, oriented x3, no distress Head: no evidence of trauma, PERRL, EOMI, no exophtalmos or lid lag, no myxedema, no xanthelasma; normal ears, nose and oropharynx Neck: normal jugular venous pulsations and no hepatojugular reflux; brisk carotid pulses without delay and no carotid bruits Chest: clear to auscultation, no signs of consolidation by percussion or palpation, normal fremitus, symmetrical and full respiratory excursions Cardiovascular: normal position and quality of the apical impulse, regular rhythm, normal first and second heart sounds, no rubs or gallops, no murmur Abdomen: no tenderness or distention, no masses by palpation, no abnormal pulsatility or arterial bruits, normal bowel sounds, no hepatosplenomegaly Extremities: no clubbing,  cyanosis or edema; 2+ radial, ulnar and brachial pulses bilaterally; 2+ right femoral, posterior tibial and dorsalis pedis pulses; 2+ left femoral, posterior tibial and dorsalis pedis pulses; no subclavian or femoral bruits Neurological: grossly nonfocal  LABS  CBC  Recent Labs  08/02/13 2230 08/02/13 2318 08/03/13 0123  WBC 12.3*  --  13.0*  HGB 14.0 13.3 14.2  HCT 39.5 39.0 39.6  MCV 90.8  --  91.0  PLT 206  --  174   Basic Metabolic Panel  Recent Labs  08/02/13 2230 08/02/13 2318 08/03/13 0123  NA 137 128* 134*  K 3.9 3.5* 4.1  CL 100 91* 100  CO2 26  --  21  GLUCOSE 127* 98 98  BUN 11 12 11   CREATININE 0.84 0.70 0.73  CALCIUM 9.1  --  8.8   Liver Function Tests  Recent Labs  08/02/13 2230 08/03/13 0123  AST 25 57*  ALT 17 22  ALKPHOS 59 64  BILITOT 0.3 0.3  PROT 6.8 6.7  ALBUMIN 3.7 3.7   No results found for this basename: LIPASE, AMYLASE,  in the last 72 hours Cardiac Enzymes  Recent Labs  08/03/13 0123  TROPONINI 6.74*   BNP No components found with this basename: POCBNP,  D-Dimer No results found for this basename: DDIMER,  in the last 72 hours Hemoglobin A1C  Recent Labs  08/03/13 0123  HGBA1C 5.5   Fasting Lipid Panel  Recent Labs  08/03/13 0123  CHOL 175  HDL 42  LDLCALC 103*  TRIG 152*  CHOLHDL 4.2   Thyroid Function Tests No results found for this basename: TSH, T4TOTAL, FREET3, T3FREE, THYROIDAB,  in the last 72 hours  Radiology Studies Imaging results have been reviewed and No results found.  TELE NSR  ECG NSR, small inferior Q waves  ASSESSMENT AND PLAN DC home. Cardiac rehab. 12 months DAPT for DES. Statin, beta blocker. He appears truly committed to permanent smoking cessation.   [name], MD, [facility] [phone] office [phone] pager 08/04/2013 8:35 AM

## 2013-08-04 NOTE — Progress Notes (Signed)
Pt given discharge instructions. Pt verbalized understanding. Pt A&O and pain free. Wife at bedside to take home. Belonging given to pt.

## 2013-08-04 NOTE — Clinical Documentation Improvement (Signed)
Possible Clinical Conditions?    _____Hyponatremia _____Other Condition _____Cannot Clinically Determine    Labs: 6/02:  Sodium: 128.  Treatment: 0.9% NaCl @ 116ml/hr x1 Liter, then decrease rate to 64ml/hr.   Thank You, Marciano Sequin, Clinical Documentation Specialist:  [phone]  [facility]- Health Information Management

## 2013-08-04 NOTE — Discharge Summary (Addendum)
Physician Discharge Summary       Patient ID: Mark Williams MRN: [id number] DOB/AGE: 64-Apr-1952 64 y.o.  Admit date: 08/02/2013 Discharge date: 08/04/2013  Discharge Diagnoses:  Principal Problem:   ST elevation myocardial infarction (STEMI) of inferior wall Active Problems:   CAD (coronary artery disease), PTCA/STENT to dRCA Xience DES, residual LAD disease 20-30% & 70% ECCENTRIC STENOSIS IN pROXIMAL RAMUS 08/02/13   STEMI (ST elevation myocardial infarction)   Tobacco abuse   Hyperlipidemia LDL goal < 70   Discharged Condition: good  Procedures: 08/02/13 emergent cardiac cath by Dr. Tresa Endo 08/02/13 successful PTCA/stenting and ultimate insertion of a Xience 2.5x15 mm DES stent, postdilated to 2.9, 7 mm and stenting of the proximal RCA with a 3.0x15 mm Xience stent, postdilated 3.2, 1 mm, with residual narrowings being 0%.      Hospital Course: 63 year old Caucasian male with past medical history of tobacco abuse and no significant past medical or past surgical history who presented with substernal pressure type chest pain, nonradiating associate with shortness of breath that started at 7:30-8 PM on 08/02/2013. Patient called EMS and code STEMI was activated upon the initial EKG was reviewed. Patient is taken emergently to the Cath Lab:   Acute inferior wall ST segment elevation myocardial infarction secondary to total distal occlusion of the right coronary artery  20% proximal LAD stenosis and 30% irregularities of the diagonal vessel  70% eccentric stenosis in the proximal ramus intermediate/high marginal vessel  Distal RCA occlusion successfully treated with PTCA/stenting and ultimate insertion of a Xience 2.5x15 mm DES stent, postdilated to 2.9, 7 mm and stenting of the proximal RCA with a 3.0x15 mm Xience stent, postdilated 3.2, 1 mm, with residual narrowings being 0%.   Pk troponin 6.  EF 50-55%, ECG NSR, inferior Q waves, minimal residual ST change  Pt ambulated with cardiac rehab  without complications.  Pt plans to stop smoking.  Continue DAPT for 12 months for new DES.  Pt was seen and evaluated by Dr. Royann Shivers and found stable for discharge home.  CXR done prior to leaving the hospital.    Consults: None  Significant Diagnostic Studies:  BMET    Component Value Date/Time   NA 134* 08/03/2013 0123   K 4.1 08/03/2013 0123   CL 100 08/03/2013 0123   CO2 21 08/03/2013 0123   GLUCOSE 98 08/03/2013 0123   BUN 11 08/03/2013 0123   CREATININE 0.73 08/03/2013 0123   CALCIUM 8.8 08/03/2013 0123   GFRNONAA >90 08/03/2013 0123   GFRAA >90 08/03/2013 0123    CBC    Component Value Date/Time   WBC 13.0* 08/03/2013 0123   RBC 4.35 08/03/2013 0123   HGB 14.2 08/03/2013 0123   HCT 39.6 08/03/2013 0123   PLT 174 08/03/2013 0123   MCV 91.0 08/03/2013 0123   MCH 32.6 08/03/2013 0123   MCHC 35.9 08/03/2013 0123   RDW 13.4 08/03/2013 0123    Lipid Panel     Component Value Date/Time   CHOL 175 08/03/2013 0123   TRIG 152* 08/03/2013 0123   HDL 42 08/03/2013 0123   CHOLHDL 4.2 08/03/2013 0123   VLDL 30 08/03/2013 0123   LDLCALC 103* 08/03/2013 0123   Troponin  Pk 6.74  HgbA1C 5.5  EKG at discharge:  Normal sinus rhythm Possible Inferior infarct , age undetermined Cannot rule out Anterior infarct , age undetermined Abnormal ECG  Discharge Exam: Blood pressure 102/67, pulse 88, temperature 98.1 F (36.7 C), temperature source Oral, resp. rate 14, height  5\' 7"  (1.702 m), weight 147 lb 4.3 oz (66.8 kg), SpO2 98.00%.    Disposition: home    Medication List         aspirin 81 MG EC tablet  Take 1 tablet (81 mg total) by mouth daily.     atorvastatin 80 MG tablet  Commonly known as:  LIPITOR  Take 1 tablet (80 mg total) by mouth daily at 6 PM.     metoprolol tartrate 25 MG tablet  Commonly known as:  LOPRESSOR  Take 0.5 tablets (12.5 mg total) by mouth 2 (two) times daily.     nitroGLYCERIN 0.4 MG SL tablet  Commonly known as:  NITROSTAT  Place 1 tablet (0.4 mg total) under the tongue every  5 (five) minutes x 3 doses as needed for chest pain.     ticagrelor 90 MG Tabs tablet  Commonly known as:  BRILINTA  Take 1 tablet (90 mg total) by mouth 2 (two) times daily.       Follow-up Information   Follow up with Lennette BihariKELLY,THOMAS A, MD On 08/12/2013. (at 2:30 PM with Boyce MediciBrittany Simmons, [facility])    Specialty:  Cardiology   Contact information:   [address] [place] [address] [phone]        Discharge Instructions: Take 1 NTG, under your tongue, while sitting.  If no relief of pain may repeat NTG, one tab every 5 minutes up to 3 tablets total over 15 minutes.  If no relief CALL 911.  If you have dizziness/lightheadness  while taking NTG, stop taking and call 911.        [facility] at [phone] if any bleeding, swelling or drainage at cath site.  May shower, no tub baths for 48 hours for groin sticks.   No lifting over 5 pounds for 1 week No driving for 1 week No work until seen in the office.  Heart Healthy Diet.  Do not stop Brilinta, stopping could cause a heart attack. Signed: Nada BoozerLaura Ingold Nurse Practitioner-Certified [facility]: [facility] 08/04/2013, 12:11 PM  Time spent on discharge : > 35 minutes.

## 2013-08-04 NOTE — Care Management Note (Signed)
    Page 1 of 1   08/04/2013     11:04:42 AM CARE MANAGEMENT NOTE 08/04/2013  Patient:  Mark Williams, Mark Williams   Account Number:  [id number]  Date Initiated:  08/03/2013  Documentation initiated by:  Avie Arenas  Subjective/Objective Assessment:   STEMI - emergent cath - post Stenting.     Action/Plan:   Anticipated DC Date:  08/05/2013   Anticipated DC Plan:  HOME/SELF CARE      DC Planning Services  CM consult  Medication Assistance      Choice offered to / List presented to:             Status of service:  Completed, signed off Medicare Important Message given?   (If response is "NO", the following Medicare IM given date fields will be blank) Date Medicare IM given:   Date Additional Medicare IM given:    Discharge Disposition:  HOME/SELF CARE  Per UR Regulation:    If discussed at Long Length of Stay Meetings, dates discussed:    Comments:  Contact:  Macias,Kathy Spouse [phone]  6/4 1103a debbie Bianna Haran rn,bsn pt has brilinta 30day free card and copay assist card. he has [employer] ins.

## 2013-08-04 NOTE — Discharge Instructions (Signed)
Take 1 NTG, under your tongue, while sitting.  If no relief of pain may repeat NTG, one tab every 5 minutes up to 3 tablets total over 15 minutes.  If no relief CALL 911.  If you have dizziness/lightheadness  while taking NTG, stop taking and call 911.        Call [facility] at [phone] if any bleeding, swelling or drainage at cath site.  May shower, no tub baths for 48 hours for groin sticks.   No lifting over 5 pounds for 1 week No driving for 1 week No work until seen in the office.  Heart Healthy Diet.  Do not stop Brilinta, stopping could cause a heart attack. Acute Coronary Syndrome Acute coronary syndrome (ACS) is an urgent problem in which the blood and oxygen supply to the heart is critically deficient. ACS requires hospitalization because one or more coronary arteries may be blocked. ACS represents a range of conditions including:  Previous angina that is now unstable, lasts longer, happens at rest, or is more intense.  A heart attack, with heart muscle cell injury and death. There are three vital coronary arteries that supply the heart muscle with blood and oxygen so that it can pump blood effectively. If blockages to these arteries develop, blood flow to the heart muscle is reduced. If the heart does not get enough blood, angina may occur as the first warning sign. SYMPTOMS   The most common signs of angina include:  Tightness or squeezing in the chest.  Feeling of heaviness on the chest.  Discomfort in the arms, neck, or jaw.  Shortness of breath and nausea.  Cold, wet skin.  Angina is usually brought on by physical effort or excitement which increase the oxygen needs of the heart. These states increase the blood flow needs of the heart beyond what can be delivered. TREATMENT   Medicines to help discomfort may include nitroglycerin (nitro) in the form of tablets or a spray for rapid relief, or longer-acting forms such as cream, patches, or  capsules. (Be aware that there are many side effects and possible interactions with other drugs).  Other medicines may be used to help the heart pump better.  Procedures to open blocked arteries including angioplasty or stent placement to keep the arteries open.  Open heart surgery may be needed when there are many blockages or they are in critical locations that are best treated with surgery. HOME CARE INSTRUCTIONS   Avoid smoking.  Take one baby or adult aspirin daily, if your caregiver advises. This helps reduce the risk of a heart attack.  It is very important that you follow the angina treatment prescribed by your caregiver. Make arrangements for proper follow-up care.  Eat a heart healthy diet with salt and fat restrictions as advised.  Regular exercise is good for you as long as it does not cause discomfort. Do not begin any new type of exercise until you check with your caregiver.  If you are overweight, you should lose weight.  Try to maintain normal blood lipid levels.  Keep your blood pressure under control as recommended by your caregiver.  You should tell your caregiver right away about any increase in the severity or frequency of your chest discomfort or angina attacks. When you have angina, you should stop what you are doing and sit down. This may bring relief in 3 to 5 minutes. If your caregiver has prescribed nitro, take it as directed.  If your caregiver has given  you a follow-up appointment, it is very important to keep that appointment. Not keeping the appointment could result in a chronic or permanent injury, pain, and disability. If there is any problem keeping the appointment, you must call back to this facility for assistance. SEEK IMMEDIATE MEDICAL CARE IF:   You develop nausea, vomiting, or shortness of breath.  You feel faint, lightheaded, or pass out.  Your chest discomfort gets worse.  You are sweating or experience sudden profound fatigue.  You do  not get relief of your chest pain after 3 doses of nitro.  Your discomfort lasts longer than 15 minutes. MAKE SURE YOU:   Understand these instructions.  Will watch your condition.  Will get help right away if you are not doing well or get worse. Document Released: 02/17/2005 Document Revised: 05/12/2011 Document Reviewed: 09/21/2007 [facility] Patient Information 2014 [place], [place].  Myocardial Infarction A myocardial infarction (MI) is damage to the heart that is not reversible. It is also called a heart attack. An MI usually occurs when a heart (coronary) artery becomes blocked or narrowed. This cuts off the blood supply to the heart. When one or more of the heart (coronary) arteries becomes blocked, that area of the heart begins to die. This causes pain felt during an MI.  If you think you might be having an MI, call your local emergency services immediately (911 in [place] It is recommended that you take a 162 mg non-enteric coated aspirin if you do not have an aspirin allergy. Do not drive yourself to the hospital or wait to see if your symptoms go away. The sooner MI is treated, the greater the amount of heart muscle saved. Time is muscle. It can save your life. CAUSES  An MI can occur from:  A gradual buildup of a fatty substance called plaque. When plaque builds up in the arteries, this condition is called atherosclerosis. This buildup can block or reduce the blood supply to the heart artery(s).  A sudden plaque rupture within a heart artery that causes a blood clot (thrombus). A blood clot can block the heart artery which does not allow blood flow to the heart.  A severe tightening (spasm) of the heart artery. This is a less common cause of a heart attack. When a heart artery spasms, it cuts off blood flow through the artery. Spasms can occur in heart arteries that do not have atherosclerosis. RISK FACTORS People at risk for an MI usually have one or more risk factors, such  as:  High blood pressure.  High cholesterol.  Smoking.  Gender. Men have a higher heart attack risk.  Overweight/obesity.  Age.  Family history.  Lack of exercise.  Diabetes.  Stress.  Excessive alcohol use.  Street drug use (cocaine and methamphetamines). SYMPTOMS  MI symptoms can vary, such as:  In both men and women, MI symptoms can include the following:  Chest pain. The chest pain may feel like a crushing, squeezing, or "pressure" type feeling. MI pain can be "referred," meaning pain can be caused in one part of the body but felt in another part of the body. Referred MI pain may occur in the left arm, neck, or jaw. Pain may even be felt in the right arm.  Shortness of breath (dyspnea).  Heartburn or indigestion with or without vomiting, shortness of breath, or sweating (diaphoresis).  Sudden, cold sweats.  Sudden lightheadedness.  Upper back pain.  Women can have unique MI symptoms, such as:  Unexplained feelings of nervousness  or anxiety.  Discomfort between the shoulder blades (scapula) or upper back.  Tingling in the hands and arms.  In elderly people (regardless of gender), MI symptoms can be subtle, such as:  Sweating (diaphoresis).  Shortness of breath (dyspnea).  General tiredness (fatigue) or not feeling well (malaise). DIAGNOSIS  Diagnosis of an MI involves several tests such as:  An assessment of your vital signs such as heart rhythm, blood pressure, respiratory rate, and oxygen level.  An EKG (ECG) to look at the electrical activity of your heart.  Blood tests called cardiac markers are drawn at scheduled times to measure proteins or enzymes released by the damaged heart muscle.  A chest X-ray.  An echocardiogram to evaluate heart motion and blood flow.  Coronary angiography (cardiac catheterization). This is a diagnostic procedure to look at the heart arteries. TREATMENT  Acute Intervention. For an MI, the national standard in  the [place] is to have an acute intervention in under 90 minutes from the time you get to the hospital. An acute intervention is a special procedure to open up the heart arteries. It is done in a treatment room called a "catheterization lab" (cath lab). Some hospitals do no have a cath lab. If you are having an MI and the hospital does not have a cath lab, the standard is to transport you to a hospital that has one. In the cath lab, acute intervention includes:  Angioplasty. An angioplasty involves inserting a thin, flexible tube (catheter) into an artery in either your groin or wrist. The catheter is threaded to the heart arteries. A balloon at the end of the catheter is inflated to open a narrowed or blocked heart artery. During an angioplasty procedure, a small mesh tube (stent) may be used to keep the heart artery open. Depending on your condition and health history, one of two types of stents may be placed:  Drug-eluting stent (DES). A DES is coated with a medicine to prevent scar tissue from growing over the stent. With drug-eluting stents, blood thinning medicine will need to be taken for up to a year.  Bare metal stent. This type of stent has no special coating to keep tissue from growing over it. This type of stent is used if you cannot take blood thinning medicine for a prolonged time or you need surgery in the near future. After a bare metal stent is placed, blood thinning medicine will need to be taken for about a month.  If you are taking blood thinning medicine (anti-platelet therapy) after stent placement, do not stop taking it unless your caregiver says it is okay to do so. Make sure you understand how long you need to take the medicine. Surgical Intervention  If an acute intervention is not successful, surgery may be needed:  Open heart surgery (coronary artery bypass graft, CABG). CABG takes a vein (saphenous vein) from your leg. The vein is then attached to the blocked heart  artery which bypasses the blockage. This then allows blood flow to the heart muscle. Additional Interventions  A "clot buster" medicine (thrombolytic) may be given. This medicine can help break up a clot in the heart artery. This medicine may be given if a person cannot get to a cath lab right away.  Intra-aortic balloon pump (IABP). If you have suffered a very severe MI and are too unstable to go to the cath lab or to surgery, an IABP may be used. This is a temporary mechanical device used to increase blood  flow to the heart and reduce the workload of the heart until you are stable enough to go to the cath lab or surgery. HOME CARE INSTRUCTIONS After an MI, you may need the following:  Medicine. Take medicine as directed by your caregiver. Medicines after an MI may:  Keep your blood from clotting easily (blood thinners).  Control your blood pressure.  Help lower your cholesterol.  Control abnormal heart rhythms.  Lifestyle changes. Under the guidance of your caregiver, lifestyle changes include:  Quitting smoking, if you smoke. Your caregiver can help you quit.  Being physically active.  Maintaining a healthy weight.  Eating a heart healthy diet. A dietitian can help you learn healthy eating options.  Managing diabetes.  Reducing stress.  Limiting alcohol intake. SEEK IMMEDIATE MEDICAL CARE IF:   You have severe chest pain, especially if the pain is crushing or pressure-like and spreads to the arms, back, neck, or jaw. This is an emergency. Do not wait to see if the pain will go away. Get medical help at once. Call your local emergency services (911 in the [place] Do not drive yourself to the hospital.  You have shortness of breath during rest, sleep, or with activity.  You have sudden sweating or clammy skin.  You feel sick to your stomach (nauseous) and throw up (vomit).  You suddenly become lightheaded or dizzy.  You feel your heart beating rapidly or you notice  "skipped" beats. MAKE SURE YOU:   Understand these instructions.  Will watch your condition.  Will get help right away if you are not doing well or get worse. Document Released: 02/17/2005 Document Revised: 11/12/2011 Document Reviewed: 07/23/2010 [facility] Patient Information 2014 [place].

## 2013-08-05 NOTE — ED Provider Notes (Signed)
I saw and evaluated the patient, reviewed the resident's note and I agree with the findings and plan.   EKG Interpretation   Date/Time:  Tuesday August 02 2013 22:31:23 EDT Ventricular Rate:  72 PR Interval:  153 QRS Duration: 76 QT Interval:  399 QTC Calculation: 437 R Axis:   40 Text Interpretation:  Sinus rhythm Ventricular premature complex Inferior  infarct, acute (RCA) Minimal ST elevation, anterior leads Lateral leads  are also involved Probable RV involvement, suggest recording right  precordial leads Confirmed by Freida Busman  MD, Agustina Witzke ([id number]) on 08/02/2013  10:36:05 PM       Toy Baker, MD 08/05/13 1546

## 2013-08-12 ENCOUNTER — Ambulatory Visit (INDEPENDENT_AMBULATORY_CARE_PROVIDER_SITE_OTHER): Payer: BC Managed Care – PPO | Admitting: Cardiology

## 2013-08-12 ENCOUNTER — Encounter: Payer: Self-pay | Admitting: Cardiology

## 2013-08-12 ENCOUNTER — Encounter: Payer: Self-pay | Admitting: Cardiovascular Disease

## 2013-08-12 VITALS — BP 124/80 | HR 55 | Ht 67.0 in | Wt 156.3 lb

## 2013-08-12 DIAGNOSIS — E785 Hyperlipidemia, unspecified: Secondary | ICD-10-CM

## 2013-08-12 DIAGNOSIS — I2510 Atherosclerotic heart disease of native coronary artery without angina pectoris: Secondary | ICD-10-CM

## 2013-08-12 NOTE — Patient Instructions (Signed)
Your physician recommends that you schedule a follow-up appointment in: 4-6 weeks with Dr Tresa Endo  Your physician has recommended you make the following change in your medication: Continue current medication

## 2013-08-12 NOTE — Progress Notes (Signed)
Patient ID: Mark Williams, male   DOB: [birth year], 63 y.o.   MRN: [id number]    08/12/2013 Mark Williams   [birth year]  [id number]  Primary Physicia Pcp Not In System Primary Cardiologist: Dr. Tresa Endo  HPI:  The patient is a 63 year old male with a past medical history significant for tobacco abuse, who presented to [facility], by EMS, on 08/02/2013 with complaints of severe substernal chest pain, associated with shortness of breath. EKG revealed ST elevations. Code STEMI was activated and he was transported urgently to the cath lab. The procedure was performed by Dr. Tresa Endo, via the right femoral artery. He was found to have an acute inferior wall ST segment elevation myocardial infarction, secondary to a totally occluded right coronary artery. This was successfully treated with PCI, utilizing a drug-eluting stent to the proximal RCA. He was also noted to have residual CAD with 20% proximal LAD stenosis and 30% irregularities in the diagonal vessel. There was also 70% stenosis in the proximal ramus intermediate/high marginal vessel. Left ventriculography revealed low normal global LV function with an ejection fraction of 50-55%. There was mild to moderate mid inferior hypocontractility. He left the Cath Lab in stable condition and was started on dual antiplatelet therapy with aspirin and Brilinta. He was also started on a beta blocker and high-dose statin. He had no post-cath complications and was discharged home on 08/04/2013.  He presents to clinic today for post-hospital followup. He is accompanied by his wife. He states that he has been doing well since discharge. He denies any recurrent chest pain. He also denies any shortness of breath, dizziness, syncope/near-syncope, orthopnea, PND and LEE. His right groin has remained stable. He states that he has been fully compliant with his medications. He has not had to use any sublingual nitroglycerin. He states that he has discontinued smoking  completely.   Current Outpatient Prescriptions  Medication Sig Dispense Refill  . aspirin EC 81 MG EC tablet Take 1 tablet (81 mg total) by mouth daily.      [name] atorvastatin (LIPITOR) 80 MG tablet Take 1 tablet (80 mg total) by mouth daily at 6 PM.  30 tablet  6  . metoprolol tartrate (LOPRESSOR) 25 MG tablet Take 0.5 tablets (12.5 mg total) by mouth 2 (two) times daily.  30 tablet  6  . nitroGLYCERIN (NITROSTAT) 0.4 MG SL tablet Place 1 tablet (0.4 mg total) under the tongue every 5 (five) minutes x 3 doses as needed for chest pain.  25 tablet  4  . ticagrelor (BRILINTA) 90 MG TABS tablet Take 1 tablet (90 mg total) by mouth 2 (two) times daily.  60 tablet  11   No current facility-administered medications for this visit.    No Known Allergies  History   Social History  . Marital Status: Married    Spouse Name: N/A    Number of Children: N/A  . Years of Education: N/A   Occupational History  . Not on file.   Social History Main Topics  . Smoking status: Former Smoker -- 1.00 packs/day for 43 years    Types: Cigarettes    Start date: 03/03/1969    Quit date: 08/02/2013  . Smokeless tobacco: Never Used  . Alcohol Use: No  . Drug Use: No  . Sexual Activity: Not on file   Other Topics Concern  . Not on file   Social History Narrative  . No narrative on file     Review of Systems: General: negative for chills, fever,  night sweats or weight changes.  Cardiovascular: negative for chest pain, dyspnea on exertion, edema, orthopnea, palpitations, paroxysmal nocturnal dyspnea or shortness of breath Dermatological: negative for rash Respiratory: negative for cough or wheezing Urologic: negative for hematuria Abdominal: negative for nausea, vomiting, diarrhea, bright red blood per rectum, melena, or hematemesis Neurologic: negative for visual changes, syncope, or dizziness All other systems reviewed and are otherwise negative except as noted above.    Blood pressure 124/80,  pulse 55, height 5\' 7"  (1.702 m), weight 156 lb 4.8 oz (70.897 kg).  General appearance: alert, cooperative and no distress Neck: no carotid bruit and no JVD Lungs: clear to auscultation bilaterally Heart: regular rate and rhythm, S1, S2 normal, no murmur, click, rub or gallop Extremities: no LEE Pulses: 2+ and symmetric Skin: war and dry Neurologic: Grossly normal  EKG normal sinus rhythm. Heart rate 55 beats per minute.  ASSESSMENT AND PLAN:   1. CAD: Status post acute inferior wall ST elevation myocardial infarction on 08/02/2013. Status post PCI plus drug-eluting stent to the proximal RCA. Also with residual mild concomitant CAD. He is on medical therapy which includes dual antiplatelet therapy with aspirin and Brilinta, as well as a beta blocker and high-dose statin. He denies any recurrent chest pain. His EKG demonstrates normal sinus rhythm. Blood pressure is stable and well-controlled. His heart rate is in the mid 50s. Continue current medications. He was provided Brilinta samples.  2. Hypertension: Blood pressure is well-controlled. Continue current plan of care.  3. Hyperlipidemia: LDL at time of MI was elevated at 103 mg/dL. Triglycerides were also elevated at 152 mg/dL. His LDL goal, in the setting of CAD is less than 70 mg/dL. He is on a high-dose statin, 80 mg of Lipitor daily. We'll continue this dose and he will need repeat lipid studies in 3 months. We'll also need to check hepatic function.  4. Tobacco abuse: He has discontinued tobacco use. He was congratulated on his efforts and was encouraged to continue to refrain from further tobacco use in the future. He seems motivated to continue with this.  PLAN  [name] is status post acute inferior wall ST elevation myocardial infarction less than 2 weeks ago. He has been doing extremely well post PCI. He has had no recurrent chest pain. His blood pressure and heart rate are both well controlled. He has discontinued smoking.He is  on the appropriate medications. He has been instructed to followup with Dr. Tresa EndoKelly in for 6 weeks for repeat evaluation.  SIMMONS, BRITTAINYPA-C 08/12/2013 2:48 PM

## 2013-08-17 ENCOUNTER — Telehealth: Payer: Self-pay | Admitting: Cardiology

## 2013-08-17 NOTE — Telephone Encounter (Signed)
Called pharmacy. They will refill medication. Wife notified. Patient is now going to take 12.5mg  BID and check BP/HR at [facility] and call office with results.   Will defer to [place], [place]

## 2013-08-17 NOTE — Telephone Encounter (Signed)
Pt is on Metoprolol,need the correct dosage he is suppose to be taking.

## 2013-08-17 NOTE — Telephone Encounter (Signed)
Will forward to [place], PA to review VS recording. Patient is now going to take lopressor 12.5mg  BID as directed per discharge summary/Rx label/last OV instruction

## 2013-08-17 NOTE — Telephone Encounter (Signed)
His BP was 121/74 and pulse was 72.

## 2013-08-17 NOTE — Telephone Encounter (Signed)
RN spoke with patient's wife to advise on metoprolol dose. Per discharge summary and last OV with Brittainy, PA on 6/12 patient is to be taking metoprolol tartrate 12.5mg  BID but has been taking 25mg  BID. Wife states the pharmacy didn't tell them to take a half tablet but RN explained it was on discharge instructions and OV instructions to take half tablet. Explained that OV note and VS information was based off medication reported at OV that he was taking (of note HR was 55bpm) and instructions stated to continue current medications. RN asked that patient check BP and HR and call office with numbers.   Wife also requested that RN call pharmacy to get patient a refill as he is running out sooner that expected given that he had been taking 1 whole tab BID instead of a 1/2 tablet BID.

## 2013-08-22 ENCOUNTER — Telehealth: Payer: Self-pay | Admitting: Cardiovascular Disease

## 2013-08-22 NOTE — Telephone Encounter (Signed)
Note to return to work on 09/05/13 at light duty. Once Dr. Tresa Endo signs will fax to [facility] [phone].

## 2013-08-22 NOTE — Telephone Encounter (Signed)
Mr. Finkle states that his employment is asking for a specific date to show when he can come back to work. Was given a letter saying he can returned to work in 2 weeks, but they are asking for a date .[name] Please call    Thanks

## 2013-08-22 NOTE — Telephone Encounter (Signed)
Release to RTW 09/05/13 light duty.  Patient notified.

## 2013-08-22 NOTE — Telephone Encounter (Signed)
LM for patient to call.  Letter done on 08/12/13 stated he could return to work in 2 weeks which, I believe will be 08/29/13.  Message sent to [facility] for clarification.

## 2013-08-26 NOTE — Addendum Note (Signed)
Addended by: Barrie Dunker on: 08/26/2013 02:58 PM   Modules accepted: Orders

## 2013-08-31 ENCOUNTER — Telehealth: Payer: Self-pay | Admitting: Cardiovascular Disease

## 2013-08-31 NOTE — Telephone Encounter (Signed)
Spoke with patient. HR had received return to work note but they need explanation of what "light duty" entails (i.e. restrictions on using forklift, lifting objects etc). Will defer to [facility] and Dr. Tresa Endo to advise.   Patient to go back to work 7/6

## 2013-08-31 NOTE — Telephone Encounter (Signed)
New Prob    Pt is inquiring about a note for him to return to work. Please call.

## 2013-09-05 ENCOUNTER — Encounter: Payer: Self-pay | Admitting: *Deleted

## 2013-09-05 ENCOUNTER — Encounter: Payer: Self-pay | Admitting: Cardiology

## 2013-09-05 ENCOUNTER — Telehealth: Payer: Self-pay | Admitting: Cardiovascular Disease

## 2013-09-05 NOTE — Telephone Encounter (Signed)
Mark Williams has a note to return to work light duty, but his employer needs more specific as to what light duty entails.  Please advise or revise letter.

## 2013-09-05 NOTE — Telephone Encounter (Signed)
Patient spoke with answering service 7/3.  Needs work note with release date faxed to his work and also next appt date and time. Please call

## 2013-09-05 NOTE — Telephone Encounter (Signed)
LM that info was sent to Dr. Tresa Endo and Burna Mortimer 08/31/13.

## 2013-09-05 NOTE — Telephone Encounter (Signed)
Still waiting on letter to employer to return to work.  They received a light duty letter and release date of 6th . Needs a letter explaining what light duty is.  Please call asap

## 2013-09-05 NOTE — Telephone Encounter (Signed)
Light duty letter (no lifting over 10 Lbs., no extreme heat exposure) faxed to employer attn: [id number] [phone].

## 2013-09-06 NOTE — Telephone Encounter (Signed)
This was already ha handled by Nada Boozer and [employer].

## 2013-09-06 NOTE — Telephone Encounter (Signed)
I am out for the wek. Pr had MI 08/02/13 rx with emergent PCI. Initial no lifting > 35 lbs; can slowly advance activitiy as tolerates until I see as f/u

## 2013-09-07 ENCOUNTER — Telehealth: Payer: Self-pay | Admitting: Cardiovascular Disease

## 2013-09-07 NOTE — Telephone Encounter (Signed)
Please call,wants to talk to you about his release to go back to work.

## 2013-09-07 NOTE — Telephone Encounter (Signed)
Spoke with pt, he is wanting to be seen sooner. He is unable to go back to work because they do not have light duty availability where he works. Aware there is no availability. Patient voiced understanding

## 2013-09-23 ENCOUNTER — Encounter: Payer: Self-pay | Admitting: Cardiology

## 2013-09-23 ENCOUNTER — Ambulatory Visit (INDEPENDENT_AMBULATORY_CARE_PROVIDER_SITE_OTHER): Payer: BC Managed Care – PPO | Admitting: Cardiology

## 2013-09-23 VITALS — BP 145/84 | HR 54 | Ht 67.0 in | Wt 157.0 lb

## 2013-09-23 DIAGNOSIS — I2510 Atherosclerotic heart disease of native coronary artery without angina pectoris: Secondary | ICD-10-CM

## 2013-09-23 DIAGNOSIS — Z720 Tobacco use: Secondary | ICD-10-CM

## 2013-09-23 DIAGNOSIS — I2119 ST elevation (STEMI) myocardial infarction involving other coronary artery of inferior wall: Secondary | ICD-10-CM

## 2013-09-23 DIAGNOSIS — E785 Hyperlipidemia, unspecified: Secondary | ICD-10-CM

## 2013-09-23 DIAGNOSIS — I10 Essential (primary) hypertension: Secondary | ICD-10-CM

## 2013-09-23 DIAGNOSIS — Z79899 Other long term (current) drug therapy: Secondary | ICD-10-CM

## 2013-09-23 DIAGNOSIS — F17200 Nicotine dependence, unspecified, uncomplicated: Secondary | ICD-10-CM

## 2013-09-23 MED ORDER — LISINOPRIL 2.5 MG PO TABS: 2.5000 mg | ORAL_TABLET | Freq: Every day | ORAL | Status: DC

## 2013-09-23 NOTE — Progress Notes (Signed)
09/23/2013   PCP: [name], MD   Chief Complaint  Patient presents with  . Hospitalization Follow-up    No complaints. Wants to go back to work.    Primary Cardiologist: Dr. Ruby Cola   HPI:  63 year old male with a past medical history significant for tobacco abuse, who presented to [facility], by EMS, on 08/02/2013 with complaints of severe substernal chest pain, associated with shortness of breath. EKG revealed ST elevations. Code STEMI was activated and he was transported urgently to the cath lab. The procedure was performed by Dr. Tresa Endo, via the right femoral artery. He was found to have an acute inferior wall ST segment elevation myocardial infarction, secondary to a totally occluded right coronary artery. This was successfully treated with PCI, utilizing a drug-eluting stent to the proximal RCA. He was also noted to have residual CAD with 20% proximal LAD stenosis and 30% irregularities in the diagonal vessel. There was also 70% stenosis in the proximal ramus intermediate/high marginal vessel. Left ventriculography revealed low normal global LV function with an ejection fraction of 50-55%. There was mild to moderate mid inferior hypocontractility. He left the Cath Lab in stable condition and was started on dual antiplatelet therapy with aspirin and Brilinta. He was also started on a beta blocker and high-dose statin. He had no post-cath complications and was discharged home on 08/04/2013.   Today he is back for follow up.  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 He is exercising and doing yard work as well.  He has stopped smoking. He has no chest pain and no SOB.  BP is elevated today and I rechecked.   No Known Allergies  Current Outpatient Prescriptions  Medication Sig Dispense Refill  . aspirin EC 81 MG EC tablet Take 1 tablet (81 mg total) by mouth daily.      [name] atorvastatin (LIPITOR) 80 MG tablet Take 1 tablet  (80 mg total) by mouth daily at 6 PM.  30 tablet  6  . lisinopril (PRINIVIL,ZESTRIL) 2.5 MG tablet Take 1 tablet (2.5 mg total) by mouth daily.  30 tablet  11  . metoprolol tartrate (LOPRESSOR) 25 MG tablet Take 0.5 tablets (12.5 mg total) by mouth 2 (two) times daily.  30 tablet  6  . nitroGLYCERIN (NITROSTAT) 0.4 MG SL tablet Place 1 tablet (0.4 mg total) under the tongue every 5 (five) minutes x 3 doses as needed for chest pain.  25 tablet  4  . ticagrelor (BRILINTA) 90 MG TABS tablet Take 1 tablet (90 mg total) by mouth 2 (two) times daily.  60 tablet  11  . VIAGRA 100 MG tablet Take 1 tablet by mouth as needed.       No current facility-administered medications for this visit.    Past Medical History  Diagnosis Date  . Tobacco abuse 08/04/2013  . 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 STEMI (ST elevation myocardial infarction) 08/02/13  . Hyperlipidemia LDL goal < 70 08/04/2013    Past Surgical History  Procedure Laterality Date  . Coronary angioplasty with stent placement  08/2013    Xience DES to dRCA for STEMI    JGO:TLXBWIO:MB colds or fevers, no weight changes Skin:no rashes or ulcers HEENT:no blurred vision, no congestion CV:see HPI PUL:see HPI GI:no diarrhea constipation or melena, no  indigestion GU:no hematuria, no dysuria MS:no joint pain, no claudication Neuro:no syncope, no lightheadedness Endo:no diabetes, no thyroid disease  Wt Readings from Last 3 Encounters:  09/23/13 157 lb (71.215 kg)  08/12/13 156 lb 4.8 oz (70.897 kg)  08/04/13 147 lb 4.3 oz (66.8 kg)    PHYSICAL EXAM BP 145/84  Pulse 54  Ht 5\' 7"  (1.702 m)  Wt 157 lb (71.215 kg)  BMI 24.58 kg/m2 General:Pleasant affect, NAD Skin:Warm and dry, brisk capillary refill HEENT:normocephalic, sclera clear, mucus membranes moist Neck:supple, no JVD, no bruits  Heart:S1S2 RRR without murmur,  gallup, rub or click Lungs:clear without rales, rhonchi, or wheezes ZOX:WRUEAbd:soft, non tender, + BS, do not palpate liver spleen or masses Ext:no lower ext edema, 2+ pedal pulses, 2+ radial pulses Neuro:alert and oriented, MAE, follows commands, + facial symmetry  EKG:S Brady at 54 evolutionary changes of inf. MI, no acute changes.  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  No complaints today.  CAD (coronary artery disease), PTCA/STENT to dRCA Xience DES, residual LAD disease 20-30% & 70% ECCENTRIC STENOSIS IN pROXIMAL RAMUS 08/02/13 No complaints, will follow up with Dr. Judie PetitM. Croitoru in Oct.   Hyperlipidemia with target LDL less than 70 Will recheck in Sept prior to visit with Dr. Royann Shiversroitoru in October.  We may be able to decrease dose of lipitor as this was high dose with STEMI.  Tobacco abuse Has stopped smoking, congratulated that he has not resumed.  HTN (hypertension) BP elevated today - I added lisinopril 2.5 mg daily.  Will recheck on next visit.

## 2013-09-23 NOTE — Patient Instructions (Signed)
Mark Boozer, NP has recommended making the following medication changes:  START Lisinopril 2.5 mg - take 1 tablet daily  Your physician recommends that you schedule a follow-up appointment with Dr Royann Shivers in the beginning of October.

## 2013-09-23 NOTE — Assessment & Plan Note (Signed)
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  No complaints today.

## 2013-09-23 NOTE — Assessment & Plan Note (Signed)
Will recheck in Sept prior to visit with Dr. Royann Shivers in October.  We may be able to decrease dose of lipitor as this was high dose with STEMI.

## 2013-09-23 NOTE — Assessment & Plan Note (Signed)
No complaints, will follow up with Dr. Judie Petit. Croitoru in Oct.

## 2013-09-23 NOTE — Assessment & Plan Note (Signed)
BP elevated today - I added lisinopril 2.5 mg daily.  Will recheck on next visit.

## 2013-09-23 NOTE — Assessment & Plan Note (Signed)
Has stopped smoking, congratulated that he has not resumed.

## 2013-10-05 NOTE — Addendum Note (Signed)
Addended by: Burnett Kanaris A on: 10/05/2013 05:09 PM   Modules accepted: Orders

## 2013-12-06 ENCOUNTER — Ambulatory Visit (INDEPENDENT_AMBULATORY_CARE_PROVIDER_SITE_OTHER): Payer: BC Managed Care – PPO | Admitting: Cardiovascular Disease

## 2013-12-06 ENCOUNTER — Encounter: Payer: Self-pay | Admitting: Cardiovascular Disease

## 2013-12-06 VITALS — BP 124/74 | HR 68 | Ht 67.0 in | Wt 155.7 lb

## 2013-12-06 DIAGNOSIS — I10 Essential (primary) hypertension: Secondary | ICD-10-CM

## 2013-12-06 DIAGNOSIS — I25118 Atherosclerotic heart disease of native coronary artery with other forms of angina pectoris: Secondary | ICD-10-CM

## 2013-12-06 DIAGNOSIS — Z79899 Other long term (current) drug therapy: Secondary | ICD-10-CM

## 2013-12-06 DIAGNOSIS — E782 Mixed hyperlipidemia: Secondary | ICD-10-CM

## 2013-12-06 NOTE — Patient Instructions (Signed)
Your physician recommends that you return for lab work in: FASTING at your convenience at [facility].  Dr. Royann Shivers recommends that you schedule a follow-up appointment in: June 2016.

## 2013-12-06 NOTE — Progress Notes (Signed)
Patient ID: Mark Williams, male   DOB: 1950/07/18, 63 y.o.   MRN: [id number]      Reason for office visit Followup CAD status post inferior wall STEMI and drug-eluting stent to distal RCA  Mark Williams had his first presentation with coronary disease on June 3 with an Inderal ST segment elevation myocardial infarction treated with urgent angioplasty and placement of two drug-eluting stents (Xience 3.0x15 proximal, 2.5x15 distal - lateral being at the site of the culprit lesion). He has a residual 70% stenosis in a relatively small ramus intermedius artery and scattered 20-30% lesions in the LAD artery. He did not have any problems with arrhythmia or heart failure. Left ventricular ejection fraction is well preserved. He has treated hyperlipidemia and hypertension and has dramatically cut down on smoking from over a pack a day to just 2 cigarettes a day. He finds it hard to quit altogether, especially since his wife will not quit smoking.  He has returned to work full time and sometimes has to do fairly heavy physical labor. He has no complaints of "heartburn" which was his angina pattern. He denies dyspnea, palpitations, dizziness or syncope. He has a standing prescription for Viagra, but has not needed it.   No Known Allergies  Current Outpatient Prescriptions  Medication Sig Dispense Refill  . aspirin EC 81 MG EC tablet Take 1 tablet (81 mg total) by mouth daily.      [name] atorvastatin (LIPITOR) 80 MG tablet Take 1 tablet (80 mg total) by mouth daily at 6 PM.  30 tablet  6  . lisinopril (PRINIVIL,ZESTRIL) 2.5 MG tablet Take 1 tablet (2.5 mg total) by mouth daily.  30 tablet  11  . metoprolol tartrate (LOPRESSOR) 25 MG tablet Take 0.5 tablets (12.5 mg total) by mouth 2 (two) times daily.  30 tablet  6  . nitroGLYCERIN (NITROSTAT) 0.4 MG SL tablet Place 1 tablet (0.4 mg total) under the tongue every 5 (five) minutes x 3 doses as needed for chest pain.  25 tablet  4  . ticagrelor (BRILINTA) 90 MG TABS  tablet Take 1 tablet (90 mg total) by mouth 2 (two) times daily.  60 tablet  11  . VIAGRA 100 MG tablet Take 1 tablet by mouth as needed.       No current facility-administered medications for this visit.    Past Medical History  Diagnosis Date  . Tobacco abuse 08/04/2013  . 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 STEMI (ST elevation myocardial infarction) 08/02/13  . Hyperlipidemia LDL goal < 70 08/04/2013    Past Surgical History  Procedure Laterality Date  . Coronary angioplasty with stent placement  08/2013    Xience DES to dRCA for STEMI    Family History  Problem Relation Age of Onset  . Cancer Mother   . AAA (abdominal aortic aneurysm) Father   . Healthy Sister   . Healthy Brother   . Healthy Sister   . Healthy Sister   . Healthy Sister   . Healthy Sister   . Healthy Brother   . Healthy Brother   . Healthy Brother   . Healthy Brother   . Healthy Brother   . Healthy Brother   . Healthy Brother     History   Social History  . Marital Status: Married    Spouse Name: N/A    Number of Children: N/A  . Years  of Education: N/A   Occupational History  . Not on file.   Social History Main Topics  . Smoking status: Former Smoker -- 1.00 packs/day for 43 years    Types: Cigarettes    Start date: 03/03/1969    Quit date: 08/02/2013  . Smokeless tobacco: Never Used  . Alcohol Use: No  . Drug Use: No  . Sexual Activity: Not on file   Other Topics Concern  . Not on file   Social History Narrative  . No narrative on file    Review of systems: The patient specifically denies any chest pain at rest or with exertion, dyspnea at rest or with exertion, orthopnea, paroxysmal nocturnal dyspnea, syncope, palpitations, focal neurological deficits, intermittent claudication, lower extremity edema, unexplained weight gain, cough, hemoptysis or wheezing.  The  patient also denies abdominal pain, nausea, vomiting, dysphagia, diarrhea, constipation, polyuria, polydipsia, dysuria, hematuria, frequency, urgency, abnormal bleeding or bruising, fever, chills, unexpected weight changes, mood swings, change in skin or hair texture, change in voice quality, auditory or visual problems, allergic reactions or rashes, new musculoskeletal complaints other than usual "aches and pains".   PHYSICAL EXAM BP 124/74  Pulse 68  Ht 5\' 7"  (1.702 m)  Wt 70.625 kg (155 lb 11.2 oz)  BMI 24.38 kg/m2  General: Alert, oriented x3, no distress Head: no evidence of trauma, PERRL, EOMI, no exophtalmos or lid lag, no myxedema, no xanthelasma; normal ears, nose and oropharynx Neck: normal jugular venous pulsations and no hepatojugular reflux; brisk carotid pulses without delay and no carotid bruits Chest: clear to auscultation, no signs of consolidation by percussion or palpation, normal fremitus, symmetrical and full respiratory excursions Cardiovascular: normal position and quality of the apical impulse, regular rhythm, normal first and second heart sounds, no murmurs, rubs or gallops Abdomen: no tenderness or distention, no masses by palpation, no abnormal pulsatility or arterial bruits, normal bowel sounds, no hepatosplenomegaly Extremities: no clubbing, cyanosis or edema; 2+ radial, ulnar and brachial pulses bilaterally; 2+ right femoral, posterior tibial and dorsalis pedis pulses; 2+ left femoral, posterior tibial and dorsalis pedis pulses; no subclavian or femoral bruits Neurological: grossly nonfocal   Lipid Panel     Component Value Date/Time   CHOL 175 08/03/2013 0123   TRIG 152* 08/03/2013 0123   HDL 42 08/03/2013 0123   CHOLHDL 4.2 08/03/2013 0123   VLDL 30 08/03/2013 0123   LDLCALC 103* 08/03/2013 0123    BMET    Component Value Date/Time   NA 134* 08/03/2013 0123   K 4.1 08/03/2013 0123   CL 100 08/03/2013 0123   CO2 21 08/03/2013 0123   GLUCOSE 98 08/03/2013 0123   BUN 11  08/03/2013 0123   CREATININE 0.73 08/03/2013 0123   CALCIUM 8.8 08/03/2013 0123   GFRNONAA >90 08/03/2013 0123   GFRAA >90 08/03/2013 0123     ASSESSMENT AND PLAN  Uncomplicated recovery following intraoral ST segment elevation myocardial infarction with preserved left ventricular ejection fraction and no arrhythmic complications. He has 2 drug-eluting stents and will require treatment with dual antiplatelet therapy without interruption for total of 12 months. It is time to recheck his lipid profile. His blood pressure is excellent.  We spent quite some time discussing the importance of complete and permanent smoking cessation to avoid progression of disease. He'll followup next June.  Orders Placed This Encounter  Procedures  . Comprehensive metabolic panel  . Lipid panel   No orders of the defined types were placed in this encounter.  Holli Humbles, MD, [place] [phone] office [phone] pager

## 2014-01-13 ENCOUNTER — Telehealth: Payer: Self-pay | Admitting: Cardiovascular Disease

## 2014-01-13 NOTE — Telephone Encounter (Signed)
Onalee Hua is calling from [facility] and need  To speak to someone about Mark Williams medication .[name] Please at [phone]

## 2014-01-13 NOTE — Telephone Encounter (Signed)
Spoke to pharmacist -she states patient has 2 prescription for lisinopril  1)2.5 mg  and on 12/19/13  Dr Olena Leatherwood prescribed 20 mg lisinopril RN reviewded [facility] chart - no changes from Dr Tresa Endo recently - RN informed pharmacist to contact primary Dr Olena Leatherwood She verbalized understanding.

## 2014-02-09 ENCOUNTER — Encounter (HOSPITAL_COMMUNITY): Payer: Self-pay | Admitting: Cardiovascular Disease

## 2014-02-14 ENCOUNTER — Other Ambulatory Visit: Payer: Self-pay | Admitting: Cardiology

## 2014-03-15 ENCOUNTER — Other Ambulatory Visit: Payer: Self-pay | Admitting: Cardiology

## 2014-04-19 ENCOUNTER — Other Ambulatory Visit: Payer: Self-pay | Admitting: Cardiology

## 2014-04-19 NOTE — Telephone Encounter (Signed)
Rx(s) sent to pharmacy electronically.  

## 2014-10-04 ENCOUNTER — Ambulatory Visit (INDEPENDENT_AMBULATORY_CARE_PROVIDER_SITE_OTHER): Payer: 59 | Admitting: Cardiovascular Disease

## 2014-10-04 VITALS — BP 102/54 | HR 67 | Resp 16 | Ht 67.0 in | Wt 151.3 lb

## 2014-10-04 DIAGNOSIS — E785 Hyperlipidemia, unspecified: Secondary | ICD-10-CM

## 2014-10-04 DIAGNOSIS — I10 Essential (primary) hypertension: Secondary | ICD-10-CM | POA: Diagnosis not present

## 2014-10-04 DIAGNOSIS — Z720 Tobacco use: Secondary | ICD-10-CM | POA: Diagnosis not present

## 2014-10-04 DIAGNOSIS — I25118 Atherosclerotic heart disease of native coronary artery with other forms of angina pectoris: Secondary | ICD-10-CM

## 2014-10-04 MED ORDER — LISINOPRIL 10 MG PO TABS: 10.0000 mg | ORAL_TABLET | Freq: Every day | ORAL | Status: DC

## 2014-10-04 NOTE — Patient Instructions (Signed)
Your physician wants you to follow-up in: 1 Year. You will receive a reminder letter in the mail two months in advance. If you don't receive a letter, please call our office to schedule the follow-up appointment.  Your physician has recommended you make the following change in your medication: STOP Brilinta and DECREASE Lisnopril 10 mg daily  Your physician recommends that you return for lab work in: CMP and Fasting Lipids  DO NOT take viagra or Nitroglycerin forty minutes within taking each one  Smoking Cessation Quitting smoking is important to your health and has many advantages. However, it is not always easy to quit since nicotine is a very addictive drug. Oftentimes, people try 3 times or more before being able to quit. This document explains the best ways for you to prepare to quit smoking. Quitting takes hard work and a lot of effort, but you can do it. ADVANTAGES OF QUITTING SMOKING  You will live longer, feel better, and live better.  Your body will feel the impact of quitting smoking almost immediately.  Within 20 minutes, blood pressure decreases. Your pulse returns to its normal level.  After 8 hours, carbon monoxide levels in the blood return to normal. Your oxygen level increases.  After 24 hours, the chance of having a heart attack starts to decrease. Your breath, hair, and body stop smelling like smoke.  After 48 hours, damaged nerve endings begin to recover. Your sense of taste and smell improve.  After 72 hours, the body is virtually free of nicotine. Your bronchial tubes relax and breathing becomes easier.  After 2 to 12 weeks, lungs can hold more air. Exercise becomes easier and circulation improves.  The risk of having a heart attack, stroke, cancer, or lung disease is greatly reduced.  After 1 year, the risk of coronary heart disease is cut in half.  After 5 years, the risk of stroke falls to the same as a nonsmoker.  After 10 years, the risk of lung cancer is  cut in half and the risk of other cancers decreases significantly.  After 15 years, the risk of coronary heart disease drops, usually to the level of a nonsmoker.  If you are pregnant, quitting smoking will improve your chances of having a healthy baby.  The people you live with, especially any children, will be healthier.  You will have extra money to spend on things other than cigarettes. QUESTIONS TO THINK ABOUT BEFORE ATTEMPTING TO QUIT You may want to talk about your answers with your health care provider.  Why do you want to quit?  If you tried to quit in the past, what helped and what did not?  What will be the most difficult situations for you after you quit? How will you plan to handle them?  Who can help you through the tough times? Your family? Friends? A health care provider?  What pleasures do you get from smoking? What ways can you still get pleasure if you quit? Here are some questions to ask your health care provider:  How can you help me to be successful at quitting?  What medicine do you think would be best for me and how should I take it?  What should I do if I need more help?  What is smoking withdrawal like? How can I get information on withdrawal? GET READY  Set a quit date.  Change your environment by getting rid of all cigarettes, ashtrays, matches, and lighters in your home, car, or work. Do not  let people smoke in your home.  Review your past attempts to quit. Think about what worked and what did not. GET SUPPORT AND ENCOURAGEMENT You have a better chance of being successful if you have help. You can get support in many ways.  Tell your family, friends, and coworkers that you are going to quit and need their support. Ask them not to smoke around you.  Get individual, group, or telephone counseling and support. Programs are available at [employer] and health centers. Call your local health department for information about programs in your  area.  Spiritual beliefs and practices may help some smokers quit.  Download a "quit meter" on your computer to keep track of quit statistics, such as how long you have gone without smoking, cigarettes not smoked, and money saved.  Get a self-help book about quitting smoking and staying off tobacco. [place] yourself from urges to smoke. Talk to someone, go for a walk, or occupy your time with a task.  Change your normal routine. Take a different route to work. Drink tea instead of coffee. Eat breakfast in a different place.  Reduce your stress. Take a hot bath, exercise, or read a book.  Plan something enjoyable to do every day. Reward yourself for not smoking.  Explore interactive web-based programs that specialize in helping you quit. GET MEDICINE AND USE IT CORRECTLY Medicines can help you stop smoking and decrease the urge to smoke. Combining medicine with the above behavioral methods and support can greatly increase your chances of successfully quitting smoking.  Nicotine replacement therapy helps deliver nicotine to your body without the negative effects and risks of smoking. Nicotine replacement therapy includes nicotine gum, lozenges, inhalers, nasal sprays, and skin patches. Some may be available over-the-counter and others require a prescription.  Antidepressant medicine helps people abstain from smoking, but how this works is unknown. This medicine is available by prescription.  Nicotinic receptor partial agonist medicine simulates the effect of nicotine in your brain. This medicine is available by prescription. Ask your health care provider for advice about which medicines to use and how to use them based on your health history. Your health care provider will tell you what side effects to look out for if you choose to be on a medicine or therapy. Carefully read the information on the package. Do not use any other product containing nicotine while  using a nicotine replacement product.  RELAPSE OR DIFFICULT SITUATIONS Most relapses occur within the first 3 months after quitting. Do not be discouraged if you start smoking again. Remember, most people try several times before finally quitting. You may have symptoms of withdrawal because your body is used to nicotine. You may crave cigarettes, be irritable, feel very hungry, cough often, get headaches, or have difficulty concentrating. The withdrawal symptoms are only temporary. They are strongest when you first quit, but they will go away within 10-14 days. To reduce the chances of relapse, try to:  Avoid drinking alcohol. Drinking lowers your chances of successfully quitting.  Reduce the amount of caffeine you consume. Once you quit smoking, the amount of caffeine in your body increases and can give you symptoms, such as a rapid heartbeat, sweating, and anxiety.  Avoid smokers because they can make you want to smoke.  Do not let weight gain distract you. Many smokers will gain weight when they quit, usually less than 10 pounds. Eat a healthy diet and stay active. You can always lose the  weight gained after you quit.  Find ways to improve your mood other than smoking. FOR MORE INFORMATION  www.smokefree.gov  Document Released: 02/11/2001 Document Revised: 07/04/2013 Document Reviewed: 05/29/2011 [facility] Patient Information 2015 [place], [place]. This information is not intended to replace advice given to you by your health care provider. Make sure you discuss any questions you have with your health care provider.

## 2014-10-04 NOTE — Progress Notes (Signed)
Patient ID: Mark Williams, male   DOB: [birth year], 64 y.o.   MRN: [id number]     Cardiology Office Note   Date:  10/06/2014   ID:  Mark Williams, DOB [birth year], MRN [id number]  PCP:  [name], MD  Cardiologist:   [name], MD   Chief Complaint  Patient presents with  . Follow-up      History of Present Illness: Mark Williams is a 64 y.o. male who presents for Followup CAD status post inferior wall STEMI and drug-eluting stent to distal RCA June 2015  Mark Williams had his first presentation with coronary disease on June 3 with a nonST segment elevation myocardial infarction treated with urgent angioplasty and placement of two drug-eluting stents (Xience 3.0x15 proximal, 2.5x15 distal - lateral being at the site of the culprit lesion). He has a residual 70% stenosis in a relatively small ramus intermedius artery and scattered 20-30% lesions in the LAD artery. He did not have any problems with arrhythmia or heart failure. Left ventricular ejection fraction is well preserved. He has treated hyperlipidemia and hypertension.Mark Williams He finds it hard to quit smoking, especially since his wife will not quit smoking.  He has no complaints of "heartburn" which was his angina pattern. He denies dyspnea, palpitations, dizziness or syncope. He has a standing prescription for Viagra, but has not needed it.     Past Medical History  Diagnosis Date  . Tobacco abuse 08/04/2013  . 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 STEMI (ST elevation myocardial infarction) 08/02/13  . Hyperlipidemia LDL goal < 70 08/04/2013    Past Surgical History  Procedure Laterality Date  . Coronary angioplasty with stent placement  08/2013    Xience DES to dRCA for STEMI  . Left heart catheterization with coronary angiogram N/A 08/02/2013    Procedure: LEFT HEART CATHETERIZATION WITH CORONARY ANGIOGRAM;  Surgeon: Lennette Bihari,  MD;  Location: [facility] CATH LAB;  Service: Cardiovascular;  Laterality: N/A;  . Percutaneous coronary stent intervention (pci-s)  08/02/2013    Procedure: PERCUTANEOUS CORONARY STENT INTERVENTION (PCI-S);  Surgeon: Lennette Bihari, MD;  Location: [facility] CATH LAB;  Service: Cardiovascular;;     Current Outpatient Prescriptions  Medication Sig Dispense Refill  . aspirin EC 81 MG EC tablet Take 1 tablet (81 mg total) by mouth daily.    Mark Williams atorvastatin (LIPITOR) 80 MG tablet TAKE 1 TABLET (80 MG TOTAL) BY MOUTH DAILY AT 6 PM. 30 tablet 6  . metoprolol tartrate (LOPRESSOR) 25 MG tablet TAKE 0.5 TABLETS (12.5 MG TOTAL) BY MOUTH 2 (TWO) TIMES DAILY. 30 tablet 6  . nitroGLYCERIN (NITROSTAT) 0.4 MG SL tablet Place 1 tablet (0.4 mg total) under the tongue every 5 (five) minutes x 3 doses as needed for chest pain. 25 tablet 4  . VIAGRA 100 MG tablet Take 1 tablet by mouth as needed.    Mark Williams lisinopril (PRINIVIL,ZESTRIL) 10 MG tablet Take 1 tablet (10 mg total) by mouth daily. 30 tablet 6   No current facility-administered medications for this visit.    Allergies:   Review of patient's allergies indicates no known allergies.    Social History:  The patient  reports that he quit smoking about 14 months ago. His smoking use included Cigarettes. He started smoking about 45 years ago. He has a 43 pack-year smoking history. He has never used smokeless tobacco. He reports that he does not drink alcohol  or use illicit drugs.   Family History:  The patient's family history includes AAA (abdominal aortic aneurysm) in his father; Cancer in his mother; Healthy in his brother, brother, brother, brother, brother, brother, brother, brother, sister, sister, sister, sister, and sister.    ROS:  Please see the history of present illness.    Otherwise, review of systems positive for none.   All other systems are reviewed and negative.    PHYSICAL EXAM: VS:  BP 102/54 mmHg  Pulse 67  Resp 16  Ht  (1.702 m)  Wt 151 lb 4.8  oz (68.629 kg)  BMI 23.69 kg/m2 , BMI Body mass index is 23.69 kg/(m^2).  General: Alert, oriented x3, no distress Head: no evidence of trauma, PERRL, EOMI, no exophtalmos or lid lag, no myxedema, no xanthelasma; normal ears, nose and oropharynx Neck: normal jugular venous pulsations and no hepatojugular reflux; brisk carotid pulses without delay and no carotid bruits Chest: clear to auscultation, no signs of consolidation by percussion or palpation, normal fremitus, symmetrical and full respiratory excursions Cardiovascular: normal position and quality of the apical impulse, regular rhythm, normal first and second heart sounds, no  murmurs, rubs or gallops Abdomen: no tenderness or distention, no masses by palpation, no abnormal pulsatility or arterial bruits, normal bowel sounds, no hepatosplenomegaly Extremities: no clubbing, cyanosis or edema; 2+ radial, ulnar and brachial pulses bilaterally; 2+ right femoral, posterior tibial and dorsalis pedis pulses; 2+ left femoral, posterior tibial and dorsalis pedis pulses; no subclavian or femoral bruits Neurological: grossly nonfocal Psych: euthymic mood, full affect   EKG:  EKG is ordered today. The ekg ordered today demonstrates NSR   Recent Labs: No results found for requested labs within last 365 days.    Lipid Panel    Component Value Date/Time   CHOL 175 08/03/2013 0123   TRIG 152* 08/03/2013 0123   HDL 42 08/03/2013 0123   CHOLHDL 4.2 08/03/2013 0123   VLDL 30 08/03/2013 0123   LDLCALC 103* 08/03/2013 0123      Wt Readings from Last 3 Encounters:  10/04/14 151 lb 4.8 oz (68.629 kg)  12/06/13 155 lb 11.2 oz (70.625 kg)  09/23/13 157 lb (71.215 kg)       ASSESSMENT AND PLAN:  More than a year has passed since his NSTEMI and stents. Will stop Brilinta, continue ASA.  Needs to stop smoking - discussed,why and how in detail and at length.  BP rather low, reduce lisinopril dose.  Recheck lipids    Current medicines  are reviewed at length with the patient today.  The patient does not have concerns regarding medicines.   Labs/ tests ordered today include:  Orders Placed This Encounter  Procedures  . COMPLETE METABOLIC PANEL WITH GFR  . Lipid Profile  . EKG 12-Lead   Patient Instructions  Your physician wants you to follow-up in: 1 Year. You will receive a reminder letter in the mail two months in advance. If you don't receive a letter, please call our office to schedule the follow-up appointment.  Your physician has recommended you make the following change in your medication: STOP Brilinta and DECREASE Lisnopril 10 mg daily  Your physician recommends that you return for lab work in: CMP and Fasting Lipids  DO NOT take viagra and Nitroglycerin within 24 hours of each other  Smoking Cessation   Signed, [name], MD  10/06/2014 9:31 PM    [name], MD, [facility] [phone] office [phone] pager

## 2014-10-06 ENCOUNTER — Encounter: Payer: Self-pay | Admitting: Cardiovascular Disease

## 2014-11-23 DIAGNOSIS — F17200 Nicotine dependence, unspecified, uncomplicated: Secondary | ICD-10-CM | POA: Diagnosis not present

## 2014-11-23 DIAGNOSIS — T1591XA Foreign body on external eye, part unspecified, right eye, initial encounter: Secondary | ICD-10-CM | POA: Diagnosis not present

## 2014-11-23 DIAGNOSIS — I10 Essential (primary) hypertension: Secondary | ICD-10-CM | POA: Diagnosis not present

## 2014-11-23 DIAGNOSIS — Z7982 Long term (current) use of aspirin: Secondary | ICD-10-CM | POA: Diagnosis not present

## 2014-11-23 DIAGNOSIS — E780 Pure hypercholesterolemia: Secondary | ICD-10-CM | POA: Diagnosis not present

## 2014-11-23 DIAGNOSIS — Z79899 Other long term (current) drug therapy: Secondary | ICD-10-CM | POA: Diagnosis not present

## 2015-02-09 DIAGNOSIS — R404 Transient alteration of awareness: Secondary | ICD-10-CM | POA: Diagnosis not present

## 2015-02-09 DIAGNOSIS — R531 Weakness: Secondary | ICD-10-CM | POA: Diagnosis not present

## 2015-02-09 DIAGNOSIS — R55 Syncope and collapse: Secondary | ICD-10-CM | POA: Diagnosis not present

## 2015-02-09 DIAGNOSIS — R42 Dizziness and giddiness: Secondary | ICD-10-CM | POA: Diagnosis not present

## 2015-03-11 ENCOUNTER — Other Ambulatory Visit: Payer: Self-pay | Admitting: Cardiovascular Disease

## 2015-03-12 NOTE — Telephone Encounter (Signed)
Rx request sent to pharmacy.  

## 2015-03-13 ENCOUNTER — Other Ambulatory Visit: Payer: Self-pay | Admitting: Cardiovascular Disease

## 2015-05-12 ENCOUNTER — Other Ambulatory Visit: Payer: Self-pay | Admitting: Cardiovascular Disease

## 2015-05-17 ENCOUNTER — Other Ambulatory Visit: Payer: Self-pay | Admitting: Cardiovascular Disease

## 2015-10-24 ENCOUNTER — Encounter: Payer: Self-pay | Admitting: Cardiovascular Disease

## 2015-10-24 ENCOUNTER — Ambulatory Visit (INDEPENDENT_AMBULATORY_CARE_PROVIDER_SITE_OTHER): Payer: 59 | Admitting: Cardiovascular Disease

## 2015-10-24 VITALS — BP 110/66 | HR 59 | Ht 67.0 in | Wt 147.0 lb

## 2015-10-24 DIAGNOSIS — I25118 Atherosclerotic heart disease of native coronary artery with other forms of angina pectoris: Secondary | ICD-10-CM | POA: Diagnosis not present

## 2015-10-24 DIAGNOSIS — Z720 Tobacco use: Secondary | ICD-10-CM | POA: Diagnosis not present

## 2015-10-24 DIAGNOSIS — I10 Essential (primary) hypertension: Secondary | ICD-10-CM | POA: Diagnosis not present

## 2015-10-24 DIAGNOSIS — E785 Hyperlipidemia, unspecified: Secondary | ICD-10-CM | POA: Diagnosis not present

## 2015-10-24 NOTE — Patient Instructions (Signed)
Dr Royann Shivers recommends that you continue on your current medications as directed. Please refer to the Current Medication list given to you today.  Your physician discussed the hazards of tobacco use. Tobacco use cessation is recommended and techniques and options to help you quit were discussed.  Dr Royann Shivers recommends that you schedule a follow-up appointment in 12 months. You will receive a reminder letter in the mail two months in advance. If you don't receive a letter, please call our office to schedule the follow-up appointment.  If you need a refill on your cardiac medications before your next appointment, please call your pharmacy.

## 2015-10-24 NOTE — Progress Notes (Signed)
Cardiology Office Note    Date:  10/26/2015   ID:  Mark Williams, DOB September 02, 1950, MRN [id number]  PCP:  Mark DeitersXAJE A HASANAJ, MD  Cardiologist:   Thurmon FairMihai Dylann Layne, MD   No chief complaint on file.   History of Present Illness:  Mark Williams is a 65 y.o. male with coronary artery disease presenting with non-ST segment elevation myocardial infarction and placement of drug-eluting stents (Xience 3.0x15 proximal, 2.5x15 distal ) to the right coronary artery in June 2015 (with residual 70% stenosis in a small ramus intermedius artery and scattered minor lesions in the LAD), hypertension, hyperlipidemia and ongoing smoking here for follow-up. Since his last appointment he has not had any symptoms of coronary disease. He denies exertional angina or dyspnea. He has not had palpitations, dizziness, syncope, focal neurological events, claudication or leg edema. Unfortunately he continues to smoke between 10 and 20 cigarettes a day. He is surrounded by smokers both at work and at home.  His previous angina had a pattern of "burning" in the retrosternal area. He has had some aching-like pain in the chest that curves after eating and resolves promptly with antacids.  Past Medical History:  Diagnosis Date  . 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 Hyperlipidemia LDL goal < 70 08/04/2013  . STEMI (ST elevation myocardial infarction) (HCC) 08/02/13  . Tobacco abuse 08/04/2013    Past Surgical History:  Procedure Laterality Date  . CORONARY ANGIOPLASTY WITH STENT PLACEMENT  08/2013   [facility]  . LEFT HEART CATHETERIZATION WITH CORONARY ANGIOGRAM N/A 08/02/2013   Procedure: LEFT HEART CATHETERIZATION WITH CORONARY ANGIOGRAM;  Surgeon: Lennette Biharihomas A Kelly, MD;  [facility];  Service: Cardiovascular;  Laterality: N/A;  . PERCUTANEOUS CORONARY STENT INTERVENTION (PCI-S)  08/02/2013   Procedure:  PERCUTANEOUS CORONARY STENT INTERVENTION (PCI-S);  Surgeon: Lennette Biharihomas A Kelly, MD;  [facility];  Service: Cardiovascular;;    Current Medications: Outpatient Medications Prior to Visit  Medication Sig Dispense Refill  . aspirin EC 81 MG EC tablet Take 1 tablet (81 mg total) by mouth daily.    [name]. atorvastatin (LIPITOR) 80 MG tablet TAKE 1 TABLET (80 MG TOTAL) BY MOUTH DAILY AT 6 PM. 30 tablet 7  . lisinopril (PRINIVIL,ZESTRIL) 10 MG tablet TAKE 1 TABLET (10 MG TOTAL) BY MOUTH DAILY. 30 tablet 6  . metoprolol tartrate (LOPRESSOR) 25 MG tablet TAKE 0.5 TABLETS (12.5 MG TOTAL) BY MOUTH 2 (TWO) TIMES DAILY. 30 tablet 7  . nitroGLYCERIN (NITROSTAT) 0.4 MG SL tablet Place 1 tablet (0.4 mg total) under the tongue every 5 (five) minutes x 3 doses as needed for chest pain. 25 tablet 4  . VIAGRA 100 MG tablet Take 1 tablet by mouth as needed.     No facility-administered medications prior to visit.      Allergies:   Review of patient's allergies indicates no known allergies.   Social History   Social History  . Marital status: Married    Spouse name: N/A  . Number of children: N/A  . Years of education: N/A   Social History Main Topics  . Smoking status: Former Smoker    Packs/day: 1.00    Years: 43.00    Types: Cigarettes    Start date: 03/03/1969    Quit date: 08/02/2013  . Smokeless tobacco: Never Used  . Alcohol use No  . Drug use: No  .  Sexual activity: Not Asked   Other Topics Concern  . None   Social History Narrative  . None     Family History:  The patient's family history includes AAA (abdominal aortic aneurysm) in his father; Cancer in his mother; Healthy in his brother, brother, brother, brother, brother, brother, brother, brother, sister, sister, sister, sister, and sister.   ROS:   Please see the history of present illness.    ROS All other systems reviewed and are negative.   PHYSICAL EXAM:   VS:  BP 110/66 (BP Location: Left Arm, Patient Position: Sitting,  Cuff Size: Normal)   Pulse (!) 59   Ht 5\' 7"  (1.702 m)   Wt 147 lb (66.7 kg)   SpO2 97%   BMI 23.02 kg/m    GEN: Well nourished, well developed, in no acute distress  HEENT: normal  Neck: no JVD, carotid bruits, or masses Cardiac: RRR; no murmurs, rubs, or gallops,no edema  Respiratory:  clear to auscultation bilaterally, normal work of breathing GI: soft, nontender, nondistended, + BS MS: no deformity or atrophy  Skin: warm and dry, no rash Neuro:  Alert and Oriented x 3, Strength and sensation are intact Psych: euthymic mood, full affect  Wt Readings from Last 3 Encounters:  10/24/15 147 lb (66.7 kg)  10/04/14 151 lb 4.8 oz (68.6 kg)  12/06/13 155 lb 11.2 oz (70.6 kg)      Studies/Labs Reviewed:   EKG:  EKG is ordered today.  The ekg ordered today demonstrates Normal sinus rhythm, T-wave inversion in V3 only, relatively small R waves in leads V1-V3 but otherwise normal, no ST segment changes  Recent Labs: No results found for requested labs within last 8760 hours.   Lipid Panel    Component Value Date/Time   CHOL 175 08/03/2013 0123   TRIG 152 (H) 08/03/2013 0123   HDL 42 08/03/2013 0123   CHOLHDL 4.2 08/03/2013 0123   VLDL 30 08/03/2013 0123   LDLCALC 103 (H) 08/03/2013 0123    ASSESSMENT:    1. Coronary artery disease involving native coronary artery of native heart with other form of angina pectoris (HCC)   2. Hyperlipidemia with target LDL less than 70   3. Tobacco abuse   4. Essential hypertension      PLAN:  In order of problems listed above:  1. CAD: Asymptomatic 2 years after non-ST segment elevation MI and placement of drug-eluting stents the right coronary artery. 2. HLP: On high-dose statin, recent labs not available for review, and we will order them unless they were checked by his primary care provider. Target LDL preferably less than 70. 3. Smoking cessation is strongly recommended, but I understand how difficult it would be since both his wife  and his coworkers all smoke. Spent some time reminding him of all the risks of ongoing tobacco abuse and the benefits of cessation. 4. HTN: Excellent control    Medication Adjustments/Labs and Tests Ordered: Current medicines are reviewed at length with the patient today.  Concerns regarding medicines are outlined above.  Medication changes, Labs and Tests ordered today are listed in the Patient Instructions below. Patient Instructions  Dr Royann Shivers recommends that you continue on your current medications as directed. Please refer to the Current Medication list given to you today.  Your physician discussed the hazards of tobacco use. Tobacco use cessation is recommended and techniques and options to help you quit were discussed.  Dr Royann Shivers recommends that you schedule a follow-up appointment in 12 months.  You will receive a reminder letter in the mail two months in advance. If you don't receive a letter, please call our office to schedule the follow-up appointment.  If you need a refill on your cardiac medications before your next appointment, please call your pharmacy.    Signed, [name], MD  10/26/2015 3:47 PM    [facility] [address] [place], [place], [place]  [address] Phone: [phone]; Fax: [phone]

## 2015-11-21 ENCOUNTER — Other Ambulatory Visit: Payer: Self-pay | Admitting: Cardiovascular Disease

## 2015-11-21 NOTE — Telephone Encounter (Signed)
Rx(s) sent to pharmacy electronically.  

## 2016-10-10 DIAGNOSIS — Z125 Encounter for screening for malignant neoplasm of prostate: Secondary | ICD-10-CM | POA: Diagnosis not present

## 2016-10-10 DIAGNOSIS — Z6822 Body mass index (BMI) 22.0-22.9, adult: Secondary | ICD-10-CM | POA: Diagnosis not present

## 2016-10-10 DIAGNOSIS — I10 Essential (primary) hypertension: Secondary | ICD-10-CM | POA: Diagnosis not present

## 2016-10-10 DIAGNOSIS — E782 Mixed hyperlipidemia: Secondary | ICD-10-CM | POA: Diagnosis not present

## 2016-10-10 DIAGNOSIS — G3184 Mild cognitive impairment, so stated: Secondary | ICD-10-CM | POA: Diagnosis not present

## 2016-11-28 ENCOUNTER — Ambulatory Visit: Payer: Medicare Other | Admitting: Cardiovascular Disease

## 2016-11-28 ENCOUNTER — Encounter: Payer: Self-pay | Admitting: *Deleted

## 2017-02-06 DIAGNOSIS — I10 Essential (primary) hypertension: Secondary | ICD-10-CM | POA: Diagnosis not present

## 2017-02-06 DIAGNOSIS — Z6823 Body mass index (BMI) 23.0-23.9, adult: Secondary | ICD-10-CM | POA: Diagnosis not present

## 2017-02-06 DIAGNOSIS — G3184 Mild cognitive impairment, so stated: Secondary | ICD-10-CM | POA: Diagnosis not present

## 2017-02-06 DIAGNOSIS — E782 Mixed hyperlipidemia: Secondary | ICD-10-CM | POA: Diagnosis not present

## 2017-04-09 DIAGNOSIS — M7918 Myalgia, other site: Secondary | ICD-10-CM | POA: Diagnosis not present

## 2017-04-09 DIAGNOSIS — Z6824 Body mass index (BMI) 24.0-24.9, adult: Secondary | ICD-10-CM | POA: Diagnosis not present

## 2017-04-30 DIAGNOSIS — Z6824 Body mass index (BMI) 24.0-24.9, adult: Secondary | ICD-10-CM | POA: Diagnosis not present

## 2017-04-30 DIAGNOSIS — H671 Otitis media in diseases classified elsewhere, right ear: Secondary | ICD-10-CM | POA: Diagnosis not present

## 2017-04-30 DIAGNOSIS — I10 Essential (primary) hypertension: Secondary | ICD-10-CM | POA: Diagnosis not present

## 2017-07-31 DIAGNOSIS — H671 Otitis media in diseases classified elsewhere, right ear: Secondary | ICD-10-CM | POA: Diagnosis not present

## 2017-07-31 DIAGNOSIS — I10 Essential (primary) hypertension: Secondary | ICD-10-CM | POA: Diagnosis not present

## 2017-07-31 DIAGNOSIS — Z6823 Body mass index (BMI) 23.0-23.9, adult: Secondary | ICD-10-CM | POA: Diagnosis not present

## 2017-07-31 DIAGNOSIS — M5416 Radiculopathy, lumbar region: Secondary | ICD-10-CM | POA: Diagnosis not present

## 2017-07-31 DIAGNOSIS — Z6824 Body mass index (BMI) 24.0-24.9, adult: Secondary | ICD-10-CM | POA: Diagnosis not present

## 2017-07-31 DIAGNOSIS — E782 Mixed hyperlipidemia: Secondary | ICD-10-CM | POA: Diagnosis not present

## 2017-10-19 DIAGNOSIS — E782 Mixed hyperlipidemia: Secondary | ICD-10-CM | POA: Diagnosis not present

## 2017-10-19 DIAGNOSIS — Z6824 Body mass index (BMI) 24.0-24.9, adult: Secondary | ICD-10-CM | POA: Diagnosis not present

## 2017-10-19 DIAGNOSIS — I10 Essential (primary) hypertension: Secondary | ICD-10-CM | POA: Diagnosis not present

## 2017-10-19 DIAGNOSIS — M5432 Sciatica, left side: Secondary | ICD-10-CM | POA: Diagnosis not present

## 2017-10-19 DIAGNOSIS — Z0000 Encounter for general adult medical examination without abnormal findings: Secondary | ICD-10-CM | POA: Diagnosis not present

## 2017-10-19 DIAGNOSIS — M5416 Radiculopathy, lumbar region: Secondary | ICD-10-CM | POA: Diagnosis not present

## 2017-10-22 DIAGNOSIS — M5416 Radiculopathy, lumbar region: Secondary | ICD-10-CM | POA: Diagnosis not present

## 2017-10-22 DIAGNOSIS — M5432 Sciatica, left side: Secondary | ICD-10-CM | POA: Diagnosis not present

## 2017-10-22 DIAGNOSIS — M545 Low back pain: Secondary | ICD-10-CM | POA: Diagnosis not present

## 2018-03-23 DIAGNOSIS — Z6824 Body mass index (BMI) 24.0-24.9, adult: Secondary | ICD-10-CM | POA: Diagnosis not present

## 2018-03-23 DIAGNOSIS — E782 Mixed hyperlipidemia: Secondary | ICD-10-CM | POA: Diagnosis not present

## 2018-03-23 DIAGNOSIS — I10 Essential (primary) hypertension: Secondary | ICD-10-CM | POA: Diagnosis not present

## 2018-03-23 DIAGNOSIS — M5416 Radiculopathy, lumbar region: Secondary | ICD-10-CM | POA: Diagnosis not present

## 2018-03-23 DIAGNOSIS — M5432 Sciatica, left side: Secondary | ICD-10-CM | POA: Diagnosis not present

## 2018-03-23 DIAGNOSIS — Z125 Encounter for screening for malignant neoplasm of prostate: Secondary | ICD-10-CM | POA: Diagnosis not present

## 2018-03-23 DIAGNOSIS — Z0000 Encounter for general adult medical examination without abnormal findings: Secondary | ICD-10-CM | POA: Diagnosis not present

## 2018-07-06 DIAGNOSIS — I10 Essential (primary) hypertension: Secondary | ICD-10-CM | POA: Diagnosis not present

## 2018-07-06 DIAGNOSIS — E782 Mixed hyperlipidemia: Secondary | ICD-10-CM | POA: Diagnosis not present

## 2018-07-06 DIAGNOSIS — Z6823 Body mass index (BMI) 23.0-23.9, adult: Secondary | ICD-10-CM | POA: Diagnosis not present

## 2018-07-06 DIAGNOSIS — M545 Low back pain: Secondary | ICD-10-CM | POA: Diagnosis not present

## 2018-07-16 ENCOUNTER — Other Ambulatory Visit: Payer: Self-pay | Admitting: Internal Medicine

## 2018-07-16 DIAGNOSIS — G8929 Other chronic pain: Secondary | ICD-10-CM

## 2018-07-16 DIAGNOSIS — M545 Low back pain, unspecified: Secondary | ICD-10-CM

## 2018-07-30 ENCOUNTER — Other Ambulatory Visit: Payer: Self-pay

## 2018-07-30 ENCOUNTER — Ambulatory Visit
Admission: RE | Admit: 2018-07-30 | Discharge: 2018-07-30 | Disposition: A | Discharge status: Home / Self Care | Payer: PPO | Source: Ambulatory Visit | Attending: Internal Medicine | Admitting: Internal Medicine

## 2018-07-30 DIAGNOSIS — M545 Low back pain, unspecified: Secondary | ICD-10-CM

## 2018-07-30 DIAGNOSIS — G8929 Other chronic pain: Secondary | ICD-10-CM

## 2018-07-30 MED ORDER — IOPAMIDOL (ISOVUE-M 200) INJECTION 41%
1.0000 mL | Freq: Once | INTRAMUSCULAR | Status: AC
  Administered 2018-07-30: 11:00:00 1 mL via EPIDURAL

## 2018-07-30 MED ORDER — METHYLPREDNISOLONE ACETATE 40 MG/ML INJ SUSP (RADIOLOG
120.0000 mg | Freq: Once | INTRAMUSCULAR | Status: AC
  Administered 2018-07-30: 11:00:00 120 mg via EPIDURAL

## 2018-07-30 NOTE — Discharge Instructions (Signed)

## 2018-08-18 DIAGNOSIS — Z6824 Body mass index (BMI) 24.0-24.9, adult: Secondary | ICD-10-CM | POA: Diagnosis not present

## 2018-08-18 DIAGNOSIS — M545 Low back pain: Secondary | ICD-10-CM | POA: Diagnosis not present

## 2018-08-20 ENCOUNTER — Other Ambulatory Visit: Payer: Self-pay | Admitting: Internal Medicine

## 2018-08-20 DIAGNOSIS — M545 Low back pain, unspecified: Secondary | ICD-10-CM

## 2018-08-20 DIAGNOSIS — G8929 Other chronic pain: Secondary | ICD-10-CM

## 2018-08-24 ENCOUNTER — Ambulatory Visit
Admission: RE | Admit: 2018-08-24 | Discharge: 2018-08-24 | Disposition: A | Discharge status: Home / Self Care | Payer: PPO | Source: Ambulatory Visit | Attending: Internal Medicine | Admitting: Internal Medicine

## 2018-08-24 ENCOUNTER — Other Ambulatory Visit: Payer: Self-pay | Admitting: Internal Medicine

## 2018-08-24 DIAGNOSIS — M545 Low back pain, unspecified: Secondary | ICD-10-CM

## 2018-08-24 DIAGNOSIS — M47817 Spondylosis without myelopathy or radiculopathy, lumbosacral region: Secondary | ICD-10-CM | POA: Diagnosis not present

## 2018-08-24 DIAGNOSIS — G8929 Other chronic pain: Secondary | ICD-10-CM

## 2018-08-24 MED ORDER — IOPAMIDOL (ISOVUE-M 200) INJECTION 41%
1.0000 mL | Freq: Once | INTRAMUSCULAR | Status: AC
  Administered 2018-08-24: 1 mL via EPIDURAL

## 2018-08-24 MED ORDER — METHYLPREDNISOLONE ACETATE 40 MG/ML INJ SUSP (RADIOLOG
120.0000 mg | Freq: Once | INTRAMUSCULAR | Status: AC
  Administered 2018-08-24: 120 mg via EPIDURAL

## 2018-09-21 ENCOUNTER — Telehealth: Payer: Self-pay | Admitting: Cardiovascular Disease

## 2018-09-21 NOTE — Telephone Encounter (Signed)
Called for recall - number no longer active

## 2018-10-04 ENCOUNTER — Other Ambulatory Visit: Payer: Self-pay

## 2018-10-05 DIAGNOSIS — I10 Essential (primary) hypertension: Secondary | ICD-10-CM | POA: Diagnosis not present

## 2018-10-05 DIAGNOSIS — E785 Hyperlipidemia, unspecified: Secondary | ICD-10-CM | POA: Diagnosis not present

## 2018-10-05 DIAGNOSIS — Z6823 Body mass index (BMI) 23.0-23.9, adult: Secondary | ICD-10-CM | POA: Diagnosis not present

## 2018-10-05 DIAGNOSIS — M545 Low back pain: Secondary | ICD-10-CM | POA: Diagnosis not present

## 2018-11-09 DIAGNOSIS — M5432 Sciatica, left side: Secondary | ICD-10-CM | POA: Diagnosis not present

## 2018-11-09 DIAGNOSIS — Z6824 Body mass index (BMI) 24.0-24.9, adult: Secondary | ICD-10-CM | POA: Diagnosis not present

## 2018-11-12 ENCOUNTER — Other Ambulatory Visit: Payer: Self-pay | Admitting: Internal Medicine

## 2018-11-12 DIAGNOSIS — G8929 Other chronic pain: Secondary | ICD-10-CM

## 2018-11-16 ENCOUNTER — Other Ambulatory Visit: Payer: Self-pay | Admitting: Internal Medicine

## 2018-11-16 ENCOUNTER — Other Ambulatory Visit: Payer: Self-pay

## 2018-11-16 ENCOUNTER — Ambulatory Visit
Admission: RE | Admit: 2018-11-16 | Discharge: 2018-11-16 | Disposition: A | Discharge status: Home / Self Care | Payer: PPO | Source: Ambulatory Visit | Attending: Internal Medicine | Admitting: Internal Medicine

## 2018-11-16 DIAGNOSIS — M5416 Radiculopathy, lumbar region: Secondary | ICD-10-CM | POA: Diagnosis not present

## 2018-11-16 DIAGNOSIS — G8929 Other chronic pain: Secondary | ICD-10-CM

## 2018-11-16 DIAGNOSIS — M545 Low back pain, unspecified: Secondary | ICD-10-CM

## 2018-11-16 MED ORDER — IOPAMIDOL (ISOVUE-M 200) INJECTION 41%
1.0000 mL | Freq: Once | INTRAMUSCULAR | Status: AC
  Administered 2018-11-16: 1 mL via EPIDURAL

## 2018-11-16 MED ORDER — METHYLPREDNISOLONE ACETATE 40 MG/ML INJ SUSP (RADIOLOG
120.0000 mg | Freq: Once | INTRAMUSCULAR | Status: AC
  Administered 2018-11-16: 12:00:00 120 mg via EPIDURAL

## 2018-11-16 NOTE — Discharge Instructions (Signed)

## 2018-11-17 DIAGNOSIS — Z20828 Contact with and (suspected) exposure to other viral communicable diseases: Secondary | ICD-10-CM | POA: Diagnosis not present

## 2019-01-26 DIAGNOSIS — Z6824 Body mass index (BMI) 24.0-24.9, adult: Secondary | ICD-10-CM | POA: Diagnosis not present

## 2019-01-26 DIAGNOSIS — E7849 Other hyperlipidemia: Secondary | ICD-10-CM | POA: Diagnosis not present

## 2019-01-26 DIAGNOSIS — I10 Essential (primary) hypertension: Secondary | ICD-10-CM | POA: Diagnosis not present

## 2019-01-26 DIAGNOSIS — Z1389 Encounter for screening for other disorder: Secondary | ICD-10-CM | POA: Diagnosis not present

## 2019-01-26 DIAGNOSIS — Z0000 Encounter for general adult medical examination without abnormal findings: Secondary | ICD-10-CM | POA: Diagnosis not present

## 2019-01-26 DIAGNOSIS — M5432 Sciatica, left side: Secondary | ICD-10-CM | POA: Diagnosis not present

## 2019-02-03 DIAGNOSIS — Z87891 Personal history of nicotine dependence: Secondary | ICD-10-CM | POA: Diagnosis not present

## 2019-02-03 DIAGNOSIS — Z136 Encounter for screening for cardiovascular disorders: Secondary | ICD-10-CM | POA: Diagnosis not present

## 2019-02-18 DIAGNOSIS — R42 Dizziness and giddiness: Secondary | ICD-10-CM | POA: Diagnosis not present

## 2019-02-18 DIAGNOSIS — R9431 Abnormal electrocardiogram [ECG] [EKG]: Secondary | ICD-10-CM | POA: Diagnosis not present

## 2019-02-18 DIAGNOSIS — I10 Essential (primary) hypertension: Secondary | ICD-10-CM | POA: Diagnosis not present

## 2019-04-14 ENCOUNTER — Ambulatory Visit (INDEPENDENT_AMBULATORY_CARE_PROVIDER_SITE_OTHER): Payer: PPO

## 2019-04-14 ENCOUNTER — Ambulatory Visit (INDEPENDENT_AMBULATORY_CARE_PROVIDER_SITE_OTHER): Payer: PPO | Admitting: Orthopaedic Surgery

## 2019-04-14 ENCOUNTER — Encounter: Payer: Self-pay | Admitting: Orthopaedic Surgery

## 2019-04-14 VITALS — BP 157/83 | HR 70 | Ht 67.0 in | Wt 153.0 lb

## 2019-04-14 DIAGNOSIS — G8929 Other chronic pain: Secondary | ICD-10-CM

## 2019-04-14 DIAGNOSIS — M545 Low back pain, unspecified: Secondary | ICD-10-CM

## 2019-04-14 DIAGNOSIS — M48061 Spinal stenosis, lumbar region without neurogenic claudication: Secondary | ICD-10-CM | POA: Diagnosis not present

## 2019-04-14 NOTE — Progress Notes (Signed)
Office Visit Note   Patient: Mark Williams           Date of Birth: 10-Jul-1950           MRN: [id number] Visit Date: 04/14/2019              Requested by: Neale Burly, MD [place],  [place] [id number] PCP: Neale Burly, MD   Assessment & Plan: Visit Diagnoses:  1. Chronic left-sided low back pain, unspecified whether sciatica present   2. 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  Increased pain with bending turning twisting.  Previous MRI showed severe foraminal stenosis on the left.  Patient is a long-term smoker and we discussed with severe foraminal stenosis fusion is needed to decompress the nerve and smokers have poor healing rates than non-smokers in the lumbar spine.  Will obtain a new MRI scan and see him back after the scan for review.  Patient's boss is talked with him and discussed that he needs to get his back taken care of if he is going to continue to work and patient is concerned about continued employment at this point.  He may have to look at something little bit easier than what he has been doing in the past and we will see him back after the MRI scan for review.  Today we reviewed the plain radiographs as well as previous MRI scan 2019, reviewed report and discussed pathophysiology in detail. Follow-Up Instructions: No follow-ups on file.   Orders:  Orders Placed This Encounter  Procedures  . XR Lumbar Spine 2-3 Views  . XR Pelvis 1-2 Views   No orders of the defined types were placed in this encounter.     Procedures: No procedures performed   Clinical Data: No additional findings.   Subjective: Chief Complaint  Patient presents with  . Lower Back - Pain  . Left Leg - Pain    HPI 69 year old male sent to me for evaluation by Dr.Hasanaj with chronic low back pain.  He states he had 6 epidurals last year some of them worked fantastic last one did not seem to help.  He has  had an MRI 2019 that showed severe foraminal stenosis L4-5 L5S1.  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  He is 69 years old and is gradually had increased symptoms.  After standing for 2 hours he is going to sit down and rest.  He is a long-term smoker.  Patient is here with his wife has a back brace on his head upcoming lumbar surgery as well also a smoker.  Patient's current coronary disease has had stent placements hyperlipidemia hypertension STEMI.  Review of Systems positive for hypertension, hyperlipidemia, long-term smoker, previous stent, chronic back pain.  Lumbar foraminal stenosis.   Objective: Vital Signs: BP (!) 157/83   Pulse 70   Ht 5\' 7"  (1.702 m)   Wt 153 lb (69.4 kg)   BMI 23.96 kg/m   Physical Exam Constitutional:      Appearance: He is well-developed.  HENT:     Head: Normocephalic and atraumatic.  Eyes:     Pupils: Pupils are equal, round, and reactive to light.  Neck:     Thyroid:  No thyromegaly.     Trachea: No tracheal deviation.  Cardiovascular:     Rate and Rhythm: Normal rate.  Pulmonary:     Effort: Pulmonary effort is normal.     Breath sounds: No wheezing.  Abdominal:     General: Bowel sounds are normal.     Palpations: Abdomen is soft.  Skin:    General: Skin is warm and dry.     Capillary Refill: Capillary refill takes less than 2 seconds.  Neurological:     Mental Status: He is alert and oriented to person, place, and time.  Psychiatric:        Behavior: Behavior normal.        Thought Content: Thought content normal.        Judgment: Judgment normal.     Ortho Exam patient negative straight leg raising anterior tib gastrocsoleus is strong is able to heel and toe walk.  He walks with hip slightly flexed after he is walking for a number of steps he straightens up slightly  better.  Knee and ankle jerk are intact negative logroll to his hips.  Specialty Comments:  No specialty comments available.  Imaging: No results found.   PMFS History: Patient Active Problem List   Diagnosis Date Noted  . Lumbar foraminal stenosis 04/14/2019  . HTN (hypertension) 09/23/2013  . Tobacco abuse 08/04/2013  . 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 Hyperlipidemia with target LDL less than 70 08/04/2013  . STEMI (ST elevation myocardial infarction) (HCC) 08/03/2013  . ST elevation myocardial infarction (STEMI) of inferior wall (HCC) 08/03/2013   Past Medical History:  Diagnosis Date  . 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 Hyperlipidemia LDL goal < 70 08/04/2013  . STEMI (ST elevation myocardial infarction) (HCC) 08/02/13  . Tobacco abuse 08/04/2013    Family History  Problem Relation Age of Onset  . Cancer Mother   . AAA (abdominal aortic aneurysm) Father   . Healthy Sister   . Healthy Brother   . Healthy Sister   . Healthy Sister   . Healthy Sister   . Healthy Sister   . Healthy Brother   . Healthy Brother   . Healthy Brother   . Healthy Brother   . Healthy Brother   . Healthy Brother   . Healthy Brother     Past Surgical History:  Procedure Laterality Date  . CORONARY ANGIOPLASTY WITH STENT PLACEMENT  08/2013   Xience DES to [facility] for STEMI  . LEFT HEART CATHETERIZATION WITH CORONARY ANGIOGRAM N/A 08/02/2013   Procedure: LEFT HEART CATHETERIZATION WITH CORONARY ANGIOGRAM;  Surgeon: Lennette Bihari, MD;  Location: [facility] CATH LAB;  Service: Cardiovascular;  Laterality: N/A;  . PERCUTANEOUS CORONARY STENT INTERVENTION (PCI-S)  08/02/2013   Procedure: PERCUTANEOUS CORONARY STENT INTERVENTION (PCI-S);  Surgeon: Lennette Bihari, MD;  Location: [facility]  CATH LAB;  Service: Cardiovascular;;   Social History   Occupational History  . Not on file  Tobacco Use  . Smoking status: Former Smoker    Packs/day: 1.00    Years: 43.00    Pack years: 43.00    Types: Cigarettes    Start date: 03/03/1969    Quit date: 08/02/2013    Years since quitting: 5.7  . Smokeless tobacco: Never Used  Substance and Sexual Activity  .  Alcohol use: No  . Drug use: No  . Sexual activity: Not on file

## 2019-04-22 ENCOUNTER — Ambulatory Visit (INDEPENDENT_AMBULATORY_CARE_PROVIDER_SITE_OTHER): Payer: PPO | Admitting: Orthopaedic Surgery

## 2019-04-22 ENCOUNTER — Encounter: Payer: Self-pay | Admitting: Orthopaedic Surgery

## 2019-04-22 ENCOUNTER — Other Ambulatory Visit: Payer: Self-pay

## 2019-04-22 VITALS — Ht 67.0 in | Wt 153.0 lb

## 2019-04-22 DIAGNOSIS — M5126 Other intervertebral disc displacement, lumbar region: Secondary | ICD-10-CM | POA: Diagnosis not present

## 2019-04-22 DIAGNOSIS — M48061 Spinal stenosis, lumbar region without neurogenic claudication: Secondary | ICD-10-CM | POA: Diagnosis not present

## 2019-04-22 DIAGNOSIS — M4807 Spinal stenosis, lumbosacral region: Secondary | ICD-10-CM | POA: Diagnosis not present

## 2019-04-22 DIAGNOSIS — M47816 Spondylosis without myelopathy or radiculopathy, lumbar region: Secondary | ICD-10-CM | POA: Diagnosis not present

## 2019-04-26 ENCOUNTER — Encounter: Payer: Self-pay | Admitting: Orthopaedic Surgery

## 2019-04-26 DIAGNOSIS — M5126 Other intervertebral disc displacement, lumbar region: Secondary | ICD-10-CM | POA: Insufficient documentation

## 2019-04-26 NOTE — Progress Notes (Signed)
Office Visit Note   Patient: Mark Williams           Date of Birth: [birth year]           MRN: [id number] Visit Date: 04/22/2019              Requested by: Neale Burly, MD [place],  [place] [id number] PCP: Neale Burly, MD   Assessment & Plan: Visit Diagnoses:  1. Lumbar foraminal stenosis   2. Protrusion of lumbar intervertebral disc     Plan: Patient is having trouble doing his job with his back pain disc protrusion and lateral recess and foraminal stenosis.  Principal problem is disc protrusion.  He has some adjacent level degenerative changes and with long-term smoking history as well as potential for adjacent level problems he elected to proceed with microdiscectomy versus single level fusion.  He has coronary disease and will need cardiac clearance.  He states his wife is getting lumbar surgery in mid March he wants to wait until after her lumbar fusion and then he is ready to proceed.  Cardiac risk discussed we discussed overnight stay in the hospital.  Lateral recess decompression on the left at L4-5 and microdiscectomy.  Questions were elicited and answered he understands request to proceed.  Follow-Up Instructions: No follow-ups on file.   Orders:  No orders of the defined types were placed in this encounter.  No orders of the defined types were placed in this encounter.     Procedures: No procedures performed   Clinical Data: No additional findings.   Subjective: Chief Complaint  Patient presents with  . Lower Back - Pain, Follow-up    MRI Lumbar Spine Review     HPI 69 year old male returns with chronic back pain left leg pain.  He has had multiple epidurals.  Recent MRI shows severe lateral recess stenosis and some foraminal stenosis with disc protrusion on the left at L4-5.  Has back and left leg pain he does heavy construction activity.  Has pain with bending lifting and is interested in continue working.  He is a smoker and has some  adjacent level degenerative changes.  Previous cardiac stent placement history of hyperlipidemia, hypertension also STEMI.  Denies fever chills no bowel bladder symptoms.  No current chest pain shortness of breath.  Patient's pain is better when he sitting and leaning forward.  Increased pain with turning twisting.  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  Review of Systems 14 point system negative other than as mentioned in HPI.   Objective: Vital Signs: Ht 5\' 7"  (1.702 m)   Wt 153 lb (69.4 kg)   BMI 23.96 kg/m   Physical Exam Constitutional:      Appearance: He is well-developed.  HENT:     Head: Normocephalic and atraumatic.  Eyes:     Pupils: Pupils are equal, round, and reactive to light.  Neck:     Thyroid: No thyromegaly.     Trachea: No tracheal deviation.  Cardiovascular:     Rate and Rhythm: Normal rate.  Pulmonary:     Effort: Pulmonary effort is normal.     Breath sounds: No wheezing.  Abdominal:     General: Bowel sounds are normal.     Palpations: Abdomen is soft.  Skin:    General: Skin is warm and dry.  Capillary Refill: Capillary refill takes less than 2 seconds.  Neurological:     Mental Status: He is alert and oriented to person, place, and time.  Psychiatric:        Behavior: Behavior normal.        Thought Content: Thought content normal.        Judgment: Judgment normal.     Ortho Exam patient slow getting from sitting to standing.  He is able to heel and toe walk.  He has increased sciatic notch tenderness on the left negative on the right, straight leg raising 90 degrees.  Knee and ankle jerk are intact.  Decreased sensation dorsum of the left foot normal in the right foot.  Peroneal posterior tib is strong.  No quad weakness.  Negative logroll to the hips.  Distal pulses are palpable.  Specialty Comments:  No specialty comments  available.  Imaging:   MRI lumbar results 04/22/19 ecess stenosis and mild right neural foraminal stenosis, stable to slightly increased. No significant spinal stenosis.  L3-4: Circumferential disc bulging results in borderline to mild bilateral lateral recess stenosis and mild-to-moderate right and mild left neural foraminal stenosis without spinal stenosis, unchanged.  L4-5: Disc bulging, a left paracentral to left foraminal disc protrusion, and mild facet and ligamentum flavum hypertrophy result in moderate spinal stenosis, moderate right and severe left lateral recess stenosis, and moderate right and severe left neural foraminal stenosis with potential left L4 and bilateral L5 nerve root impingement, not significantly changed.  L5-S1: Disc bulging eccentric to the left and mild facet hypertrophy result in borderline to mild bilateral lateral recess stenosis and moderate to severe left neural foraminal stenosis with potential left L5 nerve root impingement, unchanged. No spinal stenosis.  IMPRESSION: 1. Diffuse lumbar spondylosis without significant interval change. 2. Severe left and moderate right lateral recess and neural foraminal stenosis at L4-5. 3. Moderate to severe left neural foraminal stenosis at L5-S1.   Electronically Signed By: Sebastian Ache M.D. On: 04/22/2019 11:22    PMFS History: Patient Active Problem List   Diagnosis Date Noted  . Protrusion of lumbar intervertebral disc 04/26/2019  . Lumbar foraminal stenosis 04/14/2019  . HTN (hypertension) 09/23/2013  . Tobacco abuse 08/04/2013  . 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 Hyperlipidemia with target LDL less than 70 08/04/2013  . STEMI (ST elevation myocardial infarction) (HCC) 08/03/2013  . ST elevation myocardial infarction (STEMI) of inferior wall (HCC) 08/03/2013   Past Medical History:  Diagnosis Date  . CAD  (coronary artery disease), PTCA/STENT to dRCA Xience DES, residual LAD disease 20-30% & 70% ECCENTRIC STENOSIS IN pROXIMAL RAMUS 08/02/13 08/04/2013  . Hyperlipidemia LDL goal < 70 08/04/2013  . STEMI (ST elevation myocardial infarction) (HCC) 08/02/13  . Tobacco abuse 08/04/2013    Family History  Problem Relation Age of Onset  . Cancer Mother   . AAA (abdominal aortic aneurysm) Father   . Healthy Sister   . Healthy Brother   . Healthy Sister   . Healthy Sister   . Healthy Sister   . Healthy Sister   . Healthy Brother   . Healthy Brother   . Healthy Brother   . Healthy Brother   . Healthy Brother   . Healthy Brother   . Healthy Brother     Past Surgical History:  Procedure Laterality Date  . CORONARY ANGIOPLASTY WITH STENT PLACEMENT  08/2013  Xience DES to [facility] for STEMI  . LEFT HEART CATHETERIZATION WITH CORONARY ANGIOGRAM N/A 08/02/2013   Procedure: LEFT HEART CATHETERIZATION WITH CORONARY ANGIOGRAM;  Surgeon: Lennette Bihari, MD;  Location: [facility] CATH LAB;  Service: Cardiovascular;  Laterality: N/A;  . PERCUTANEOUS CORONARY STENT INTERVENTION (PCI-S)  08/02/2013   Procedure: PERCUTANEOUS CORONARY STENT INTERVENTION (PCI-S);  Surgeon: Lennette Bihari, MD;  Location: [facility] CATH LAB;  Service: Cardiovascular;;   Social History   Occupational History  . Not on file  Tobacco Use  . Smoking status: Former Smoker    Packs/day: 1.00    Years: 43.00    Pack years: 43.00    Types: Cigarettes    Start date: 03/03/1969    Quit date: 08/02/2013    Years since quitting: 5.7  . Smokeless tobacco: Never Used  Substance and Sexual Activity  . Alcohol use: No  . Drug use: No  . Sexual activity: Not on file

## 2019-04-28 ENCOUNTER — Telehealth: Payer: Self-pay

## 2019-04-28 DIAGNOSIS — Z0000 Encounter for general adult medical examination without abnormal findings: Secondary | ICD-10-CM | POA: Diagnosis not present

## 2019-04-28 DIAGNOSIS — Z6825 Body mass index (BMI) 25.0-25.9, adult: Secondary | ICD-10-CM | POA: Diagnosis not present

## 2019-04-28 DIAGNOSIS — I10 Essential (primary) hypertension: Secondary | ICD-10-CM | POA: Diagnosis not present

## 2019-04-28 DIAGNOSIS — E7849 Other hyperlipidemia: Secondary | ICD-10-CM | POA: Diagnosis not present

## 2019-04-28 DIAGNOSIS — M5432 Sciatica, left side: Secondary | ICD-10-CM | POA: Diagnosis not present

## 2019-04-28 NOTE — Telephone Encounter (Signed)
   [facility] Pre-operative Risk Assessment    Request for surgical clearance:  1. What type of surgery is being performed? L4-5 MICRODISCECTOMY; LATERAL DISC DECOMPRESSION  2. When is this surgery scheduled? TBD   3. What type of clearance is required (medical clearance vs. Pharmacy clearance to hold med vs. Both)? MEDICAL  4. Are there any medications that need to be held prior to surgery and how long?NONE   5. Practice name and name of physician performing surgery? [facility]  ATTN:DEBBIE  6. What is your office phone number  [phone]    7.   What is your office fax number  [phone]  8.   Anesthesia type (None, local, MAC, general) ? GENERAL    (provider comments below)

## 2019-04-28 NOTE — Telephone Encounter (Signed)
   Primary Cardiologist:Mihai Croitoru, MD  Chart reviewed as part of pre-operative protocol coverage. Because of Mark Williams's past medical history and time since last visit, he/she will require a follow-up visit in order to better assess preoperative cardiovascular risk.   Last seen by Dr.Croitoru 10/2015. Needs new provider slot with APP.   Pre-op covering staff: - Please schedule appointment and call patient to inform them. - Please contact requesting surgeon's office via preferred method (i.e, phone, fax) to inform them of need for appointment prior to surgery.   [place], [place]  04/28/2019, 12:18 PM

## 2019-04-28 NOTE — Telephone Encounter (Signed)
Message sent to scheduler for New Pt appt.

## 2019-04-28 NOTE — Telephone Encounter (Signed)
Zella Richer G sent to Tarri Fuller, CMA; [facility] [address] Scheduling; Williams, Avaletta L; [facility] Preop  Good Afternoon,   Called patient on both numbers multiple times, no answer, left vm on home phone to call back and schedule appointment. Left notes on chart for when patient calls back.   Thanks,  Kaleen Odea

## 2019-05-02 NOTE — Telephone Encounter (Signed)
Left message to CB to schedule appt for cardiac clearance

## 2019-05-03 NOTE — Telephone Encounter (Signed)
I tried to call the pt to set him up with a New Pt appt for pre op clearance as pt was last seen 10/2015 with Dr. Royann Shivers. No answer and no vm came on. Pt will need New pt appt with MD and not APP. I will send message to our scheduling team to reach out to the pt. I will send FYI to surgeon Dr. Annell Greening pt will need New Pt appt.

## 2019-05-06 ENCOUNTER — Encounter: Payer: Self-pay | Admitting: General Practice

## 2019-05-10 NOTE — Telephone Encounter (Signed)
FW: NEED NEW PT APPT PRE OP CLEARANCE Received: Today Message Contents  Jonette Pesa sent to Tarri Fuller, CMA  Hello,   I tried to contact this patient today as well on both phone #'s there is no answer on one of the #'s and the other phone # does not work.     Thanks  Jonette Pesa        Our office has tried multiple times to reach this to schedule a new pt appt for pre op clearance. I will let our pre op team know we have not been able to reach pt.

## 2019-05-10 NOTE — Telephone Encounter (Signed)
Our scheduling dept has tried numerous times to reach the pt. We have tried to contact him 3x after the third time we send a letter to the patient. He never called back.

## 2019-05-16 ENCOUNTER — Telehealth: Payer: Self-pay | Admitting: Orthopaedic Surgery

## 2019-05-16 NOTE — Telephone Encounter (Signed)
Unable to reach patient with cell [phone].  Message says call cannot be completed at this time.  Patient's home number  [phone] continues to ring (no voice mail to leave message).  Surgery sheet from Dr Ophelia Charter indicate patient's wife having fusion mid march, therefore patient will wait until after his wife's surgery.  I wanted to reach out to the patient and provide name and contact number so he will have available when ready to schedule his lumbar fusion.  Clearance request to Dr. Royann Shivers was faxed 04-26-19.

## 2019-06-09 DIAGNOSIS — Z23 Encounter for immunization: Secondary | ICD-10-CM | POA: Diagnosis not present

## 2019-06-13 ENCOUNTER — Emergency Department (HOSPITAL_COMMUNITY)
Admission: EM | Admit: 2019-06-13 | Discharge: 2019-06-13 | Disposition: A | Discharge status: Home / Self Care | Payer: PPO | Attending: Emergency Medicine | Admitting: Emergency Medicine

## 2019-06-13 ENCOUNTER — Emergency Department (HOSPITAL_COMMUNITY): Payer: PPO

## 2019-06-13 ENCOUNTER — Other Ambulatory Visit: Payer: Self-pay

## 2019-06-13 DIAGNOSIS — I252 Old myocardial infarction: Secondary | ICD-10-CM | POA: Insufficient documentation

## 2019-06-13 DIAGNOSIS — R5381 Other malaise: Secondary | ICD-10-CM | POA: Diagnosis not present

## 2019-06-13 DIAGNOSIS — I2510 Atherosclerotic heart disease of native coronary artery without angina pectoris: Secondary | ICD-10-CM | POA: Insufficient documentation

## 2019-06-13 DIAGNOSIS — Y999 Unspecified external cause status: Secondary | ICD-10-CM | POA: Insufficient documentation

## 2019-06-13 DIAGNOSIS — W293XXA Contact with powered garden and outdoor hand tools and machinery, initial encounter: Secondary | ICD-10-CM | POA: Insufficient documentation

## 2019-06-13 DIAGNOSIS — Z23 Encounter for immunization: Secondary | ICD-10-CM | POA: Diagnosis not present

## 2019-06-13 DIAGNOSIS — Y929 Unspecified place or not applicable: Secondary | ICD-10-CM | POA: Diagnosis not present

## 2019-06-13 DIAGNOSIS — Z7982 Long term (current) use of aspirin: Secondary | ICD-10-CM | POA: Insufficient documentation

## 2019-06-13 DIAGNOSIS — R69 Illness, unspecified: Secondary | ICD-10-CM | POA: Diagnosis not present

## 2019-06-13 DIAGNOSIS — I10 Essential (primary) hypertension: Secondary | ICD-10-CM | POA: Diagnosis not present

## 2019-06-13 DIAGNOSIS — Y939 Activity, unspecified: Secondary | ICD-10-CM | POA: Diagnosis not present

## 2019-06-13 DIAGNOSIS — S51812A Laceration without foreign body of left forearm, initial encounter: Secondary | ICD-10-CM | POA: Diagnosis not present

## 2019-06-13 DIAGNOSIS — Z87891 Personal history of nicotine dependence: Secondary | ICD-10-CM | POA: Insufficient documentation

## 2019-06-13 IMAGING — DX DG FOREARM 2V*L*
1 series · 2 of 2 positions shown · non-contrast
Comparison: None.

CLINICAL DATA: Chainsaw laceration

EXAM:
LEFT FOREARM - 2 VIEW

[Series 1: forearmbone · 0.14mm/px · 2 of 2 slices shown]
[im 1/2]
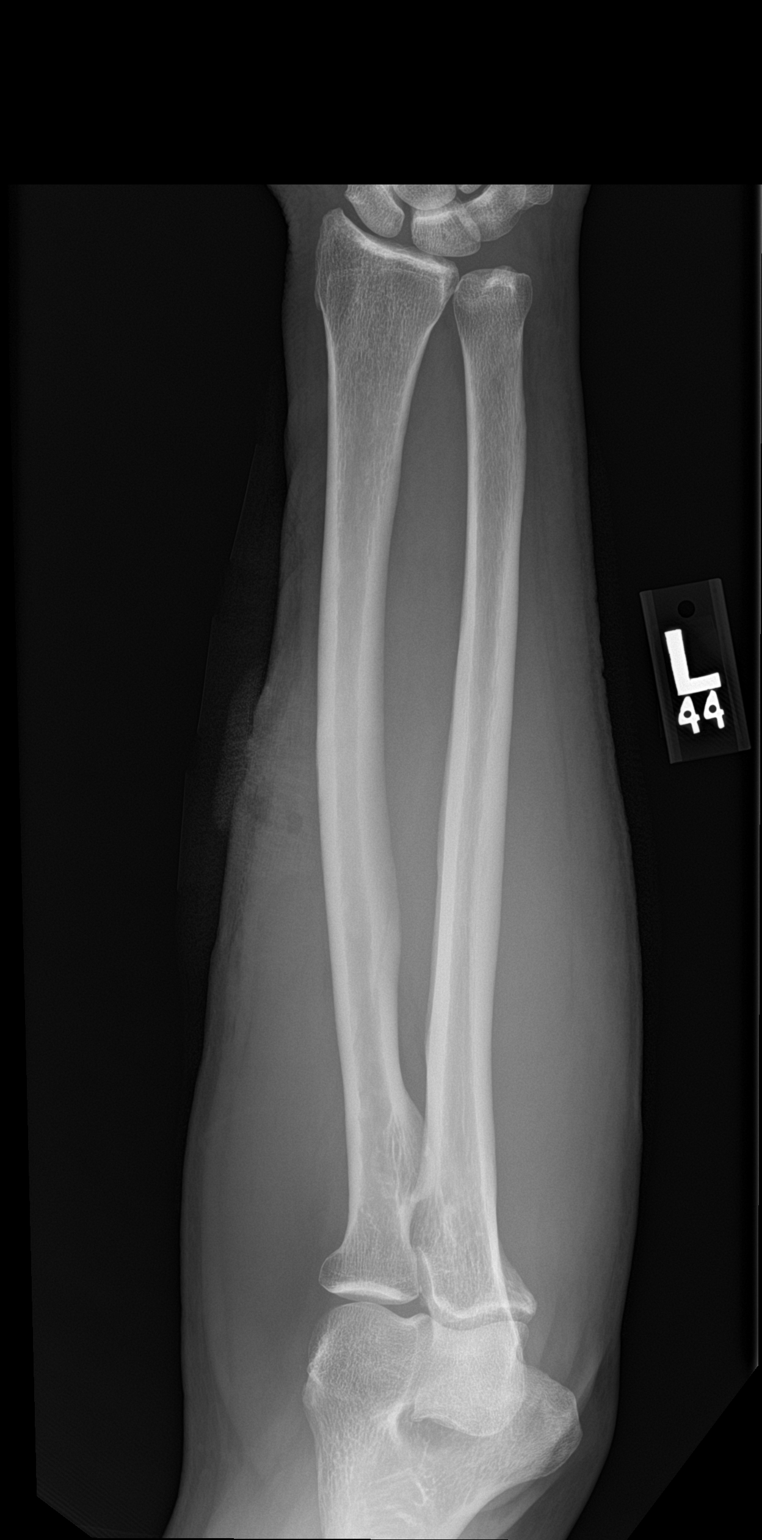
[im 2/2]
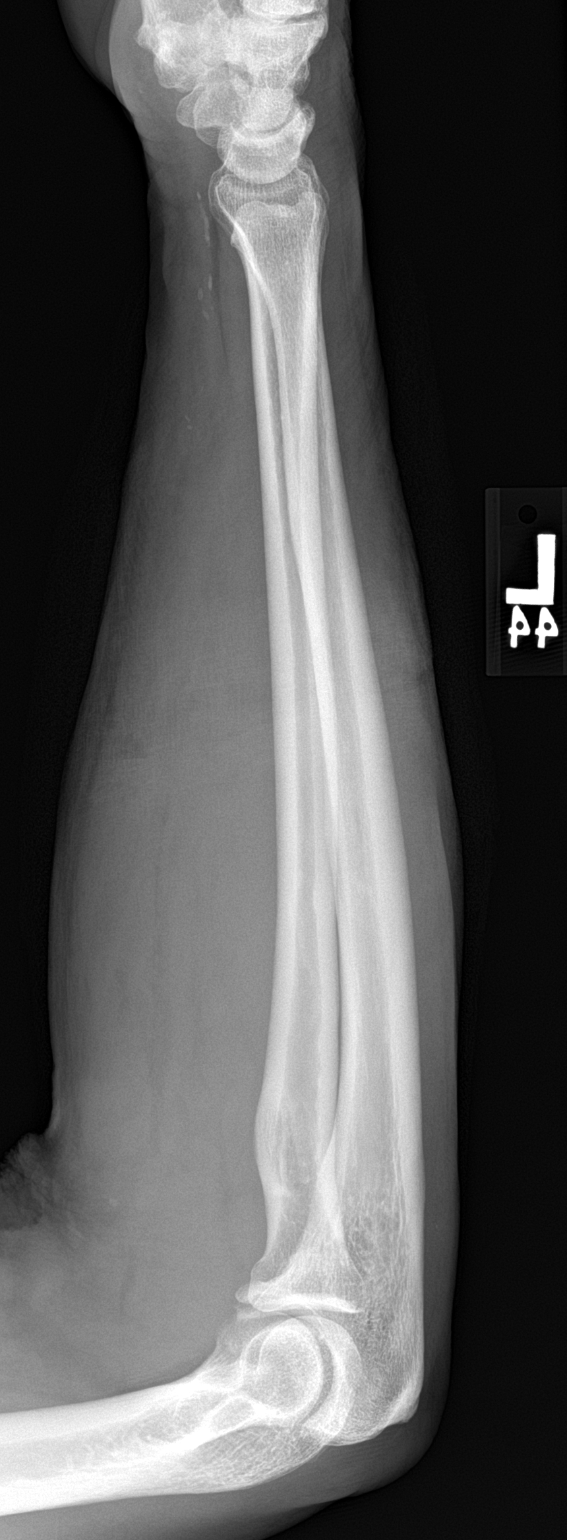

[2 of 2 positions shown; findings below may reference images not displayed]

FINDINGS: There is no evidence of fracture or other focal bone lesions. Soft
tissue laceration of the mid lateral forearm with bandage material
applied. No radiopaque foreign body.
IMPRESSION: No fracture or dislocation.  No radiopaque foreign body

## 2019-06-13 MED ORDER — LIDOCAINE-EPINEPHRINE 2 %-1:100000 IJ SOLN
20.0000 mL | Freq: Once | INTRAMUSCULAR | Status: AC
  Administered 2019-06-13: 18:00:00 20 mL
  Filled 2019-06-13: qty 20

## 2019-06-13 MED ORDER — TETANUS-DIPHTH-ACELL PERTUSSIS 5-2.5-18.5 LF-MCG/0.5 IM SUSP
0.5000 mL | Freq: Once | INTRAMUSCULAR | Status: AC
  Administered 2019-06-13: 0.5 mL via INTRAMUSCULAR
  Filled 2019-06-13: qty 0.5

## 2019-06-13 NOTE — Discharge Instructions (Addendum)
You are seen today for a laceration.  Your x-ray was reassuring.  We repaired your wound with stitches.  You will need to have a wound check in 2 days and then the stitches removed in 10 to 14 days.  Please return to the emergency department sooner if you have any signs of infection such as pus from the wound, increased redness or increased swelling.  We have started you on an antibiotic and prescribed you pain medication.  Please only take the pain medication for severe pain as it can cause sleepiness, drowsiness and addiction.

## 2019-06-13 NOTE — ED Notes (Signed)
Pt walked to bathroom, gait steady, denies any dizziness

## 2019-06-13 NOTE — ED Triage Notes (Signed)
Arrived via [facility], pt stated he was cutting a tree with a chain saw and it kick back and cut his left forearm.  Per EMS laceration is still actively bleeding however its covered with Kerlix.  Pulsation, movement and sensation positive.  Pt. Alert and oriented x4 EMS Vital 160/68, 97%, 79 Pt currently take Aspirin.

## 2019-06-13 NOTE — ED Provider Notes (Signed)
[facility] EMERGENCY DEPARTMENT Provider Note   CSN: [id number] Arrival date & time: 06/13/19  1558     History Chief Complaint  Patient presents with  . Laceration    Mark Williams is a 69 y.o. male.  69 year old male presenting to the emergency department for a laceration from a chainsaw to the left dorsum of the forearm.  Reports that the chainsaw kicked back when he is using it.  He does take a daily aspirin but no other anticoagulation.        Past Medical History:  Diagnosis Date  . 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 Hyperlipidemia LDL goal < 70 08/04/2013  . STEMI (ST elevation myocardial infarction) (HCC) 08/02/13  . Tobacco abuse 08/04/2013    Patient Active Problem List   Diagnosis Date Noted  . Protrusion of lumbar intervertebral disc 04/26/2019  . Lumbar foraminal stenosis 04/14/2019  . HTN (hypertension) 09/23/2013  . Tobacco abuse 08/04/2013  . 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 Hyperlipidemia with target LDL less than 70 08/04/2013  . STEMI (ST elevation myocardial infarction) (HCC) 08/03/2013  . ST elevation myocardial infarction (STEMI) of inferior wall (HCC) 08/03/2013    Past Surgical History:  Procedure Laterality Date  . CORONARY ANGIOPLASTY WITH STENT PLACEMENT  08/2013   Xience DES to [facility] for STEMI  . LEFT HEART CATHETERIZATION WITH CORONARY ANGIOGRAM N/A 08/02/2013   Procedure: LEFT HEART CATHETERIZATION WITH CORONARY ANGIOGRAM;  Surgeon: Lennette Bihari, MD;  Location: [facility] CATH LAB;  Service: Cardiovascular;  Laterality: N/A;  . PERCUTANEOUS CORONARY STENT INTERVENTION (PCI-S)  08/02/2013   Procedure: PERCUTANEOUS CORONARY STENT INTERVENTION (PCI-S);  Surgeon: Lennette Bihari, MD;  Location: [facility] CATH LAB;   Service: Cardiovascular;;       Family History  Problem Relation Age of Onset  . Cancer Mother   . AAA (abdominal aortic aneurysm) Father   . Healthy Sister   . Healthy Brother   . Healthy Sister   . Healthy Sister   . Healthy Sister   . Healthy Sister   . Healthy Brother   . Healthy Brother   . Healthy Brother   . Healthy Brother   . Healthy Brother   . Healthy Brother   . Healthy Brother     Social History   Tobacco Use  . Smoking status: Former Smoker    Packs/day: 1.00    Years: 43.00    Pack years: 43.00    Types: Cigarettes    Start date: 03/03/1969    Quit date: 08/02/2013    Years since quitting: 5.8  . Smokeless tobacco: Never Used  Substance Use Topics  . Alcohol use: No  . Drug use: No    Home Medications Prior to Admission medications   Medication Sig Start Date End Date Taking? Authorizing Provider  aspirin EC 81 MG EC tablet Take 1 tablet (81 mg total) by mouth daily. Patient not taking: Reported on 04/14/2019 08/04/13   Leone Brand, NP  atorvastatin (LIPITOR) 80 MG tablet TAKE 1 TABLET (80 MG TOTAL) BY MOUTH DAILY AT 6 PM. 11/21/15   Croitoru, Mihai, MD  diclofenac (VOLTAREN) 75 MG EC tablet Take 75 mg by mouth 2 (two) times daily. 03/24/19   [provider]  donepezil (ARICEPT) 10 MG  tablet Take 10 mg by mouth daily. 03/24/19   [provider]  gabapentin (NEURONTIN) 400 MG capsule  03/24/19   [provider]  lisinopril (PRINIVIL,ZESTRIL) 10 MG tablet TAKE 1 TABLET (10 MG TOTAL) BY MOUTH DAILY. 05/14/15   Croitoru, Mihai, MD  metoprolol tartrate (LOPRESSOR) 25 MG tablet TAKE 0.5 TABLETS (12.5 MG TOTAL) BY MOUTH 2 (TWO) TIMES DAILY. 11/21/15   Croitoru, Mihai, MD  nitroGLYCERIN (NITROSTAT) 0.4 MG SL tablet Place 1 tablet (0.4 mg total) under the tongue every 5 (five) minutes x 3 doses as needed for chest pain. 08/04/13   Leone Brand, NP  VIAGRA 100 MG tablet Take 1 tablet by mouth as needed. 09/13/13   [provider]     Allergies    Patient has no known allergies.  Review of Systems   Review of Systems  Constitutional: Negative for fever.  Skin: Positive for wound.  Allergic/Immunologic: Negative for immunocompromised state.  Neurological: Negative for syncope and light-headedness.  Hematological: Bruises/bleeds easily.    Physical Exam Updated Vital Signs BP (!) 150/81   Pulse 69   Temp 98.4 F (36.9 C) (Temporal)   Resp 15   Ht 5\' 7"  (1.702 m)   Wt 70.8 kg   SpO2 97%   BMI 24.43 kg/m   Physical Exam Vitals and nursing note reviewed.  Constitutional:      Appearance: Normal appearance.  HENT:     Head: Normocephalic.  Eyes:     Conjunctiva/sclera: Conjunctivae normal.  Pulmonary:     Effort: Pulmonary effort is normal.  Musculoskeletal:     Comments: Patient has normal range of motion, strength, sensation of the entire left distal forearm with normal distal pulses.  Skin:    General: Skin is dry.     Comments: There is a large 10 cm laceration which is transverse across the mid left forearm and oozing.   Neurological:     Mental Status: He is alert.  Psychiatric:        Mood and Affect: Mood normal.     ED Results / Procedures / Treatments   Labs (all labs ordered are listed, but only abnormal results are displayed) Labs Reviewed - No data to display  EKG None  Radiology DG Forearm Left  Result Date: 06/13/2019 CLINICAL DATA:  Chainsaw laceration EXAM: LEFT FOREARM - 2 VIEW COMPARISON:  None. FINDINGS: There is no evidence of fracture or other focal bone lesions. Soft tissue laceration of the mid lateral forearm with bandage material applied. No radiopaque foreign body. IMPRESSION: No fracture or dislocation.  No radiopaque foreign body Electronically Signed   By: 08/13/2019 M.D.   On: 06/13/2019 16:51    Procedures .06/12/2021Laceration Repair  Date/Time: 06/13/2019 5:55 PM Performed by: 08/13/2019, PA-C Authorized by: Arlyn Dunning, PA-C   Consent:     Consent obtained:  Verbal   Consent given by:  Patient   Risks discussed:  Infection, need for additional repair, pain, poor cosmetic result and poor wound healing   Alternatives discussed:  No treatment and delayed treatment Universal protocol:    Procedure explained and questions answered to patient or proxy's satisfaction: yes     Relevant documents present and verified: yes     Test results available and properly labeled: yes     Imaging studies available: yes     Required blood products, implants, devices, and special equipment available: yes     Site/side marked: yes     Immediately  prior to procedure, a time out was called: yes     Patient identity confirmed:  Verbally with patient Anesthesia (see MAR for exact dosages):    Anesthesia method:  Local infiltration   Local anesthetic:  Lidocaine 2% WITH epi Laceration details:    Location:  Shoulder/arm   Shoulder/arm location:  L lower arm   Length (cm):  10   Depth (mm):  20 Repair type:    Repair type:  Complex Pre-procedure details:    Preparation:  Patient was prepped and draped in usual sterile fashion Exploration:    Hemostasis achieved with:  Epinephrine and direct pressure   Wound exploration: wound explored through full range of motion and entire depth of wound probed and visualized     Wound extent: fascia violated     Wound extent: no foreign bodies/material noted, no nerve damage noted, no tendon damage noted, no underlying fracture noted and no vascular damage noted     Contaminated: yes (Dirt)   Treatment:    Area cleansed with:  Betadine, saline and soap and water   Amount of cleaning:  Extensive   Irrigation solution:  Sterile saline   Irrigation method:  Syringe and pressure wash Fascia repair:    Suture size:  3-0   Suture material:  Plain gut   Suture technique:  Simple interrupted   Number of sutures:  4 Subcutaneous repair:    Suture size:  3-0   Suture material:  Plain gut   Suture technique:   Simple interrupted   Number of sutures:  3 Mucous membrane repair:    Wound mucous membrane closure material used: Nylon. Skin repair:    Repair method:  Sutures   Suture size:  4-0   Suture material:  Nylon   Suture technique:  Simple interrupted   Number of sutures:  9 Approximation:    Approximation:  Close Post-procedure details:    Dressing:  Antibiotic ointment, non-adherent dressing and bulky dressing   Patient tolerance of procedure:  Tolerated well, no immediate complications   (including critical care time)  Medications Ordered in ED Medications  lidocaine-EPINEPHrine (XYLOCAINE W/EPI) 2 %-1:100000 (with pres) injection 20 mL (has no administration in time range)  Tdap (BOOSTRIX) injection 0.5 mL (0.5 mLs Intramuscular Given 06/13/19 1613)    ED Course  I have reviewed the triage vital signs and the nursing notes.  Pertinent labs & imaging results that were available during my care of the patient were reviewed by me and considered in my medical decision making (see chart for details).  Clinical Course as of Jun 12 1756  Mon Jun 13, 2019  1754 Patient with laceration to the forearm from chainsaw.  He is neurovascularly intact.  Also seen by Dr. Stevie Kern.  X-ray reassuring.  Patient had wound repaired by myself with extensive irrigation.  Patient will follow up with primary care doctor in 2 days for wound recheck.  Start Keflex.  Tetanus was updated today.   [KM]    Clinical Course User Index [KM] Jeral Pinch   MDM Rules/Calculators/A&P                      Based on review of vitals, medical screening exam, lab work and/or imaging, there does not appear to be an acute, emergent etiology for the patient's symptoms. Counseled pt on good return precautions and encouraged both PCP and ED follow-up as needed.  Prior to discharge, I also discussed incidental imaging findings with  patient in detail and advised appropriate, recommended follow-up in  detail.  Clinical Impression: 1. Laceration of left forearm, initial encounter     Disposition: Discharge  Prior to providing a prescription for a controlled substance, I independently reviewed the patient's recent prescription history on the [place]. The patient had no recent or regular prescriptions and was deemed appropriate for a brief, less than 3 day prescription of narcotic for acute analgesia.  This note was prepared with assistance of Systems analyst. Occasional wrong-word or sound-a-like substitutions may have occurred due to the inherent limitations of voice recognition software.  Final Clinical Impression(s) / ED Diagnoses Final diagnoses:  Laceration of left forearm, initial encounter    Rx / DC Orders ED Discharge Orders    None       Kristine Royal 06/13/19 1759    Lucrezia Starch, MD 06/15/19 1300

## 2019-06-14 ENCOUNTER — Telehealth (HOSPITAL_COMMUNITY): Payer: Self-pay | Admitting: Physician Assistant

## 2019-06-14 MED ORDER — HYDROCODONE-ACETAMINOPHEN 5-325 MG PO TABS: 1.0000 | ORAL_TABLET | Freq: Four times a day (QID) | ORAL | 0 refills | Status: AC | PRN

## 2019-06-14 MED ORDER — CEPHALEXIN 500 MG PO CAPS: 500.0000 mg | ORAL_CAPSULE | Freq: Two times a day (BID) | ORAL | 0 refills | Status: AC

## 2019-06-16 DIAGNOSIS — S51812A Laceration without foreign body of left forearm, initial encounter: Secondary | ICD-10-CM | POA: Diagnosis not present

## 2019-06-16 DIAGNOSIS — Z6824 Body mass index (BMI) 24.0-24.9, adult: Secondary | ICD-10-CM | POA: Diagnosis not present

## 2019-06-16 NOTE — Telephone Encounter (Cosign Needed)
Called in patients medication to pharmacy for pain meds and abx

## 2019-07-20 ENCOUNTER — Other Ambulatory Visit: Payer: Self-pay | Admitting: Neurosurgery

## 2019-07-20 ENCOUNTER — Telehealth: Payer: Self-pay

## 2019-07-20 DIAGNOSIS — M5416 Radiculopathy, lumbar region: Secondary | ICD-10-CM | POA: Diagnosis not present

## 2019-07-20 DIAGNOSIS — M545 Low back pain: Secondary | ICD-10-CM | POA: Diagnosis not present

## 2019-07-20 DIAGNOSIS — M5126 Other intervertebral disc displacement, lumbar region: Secondary | ICD-10-CM | POA: Diagnosis not present

## 2019-07-20 DIAGNOSIS — M48061 Spinal stenosis, lumbar region without neurogenic claudication: Secondary | ICD-10-CM | POA: Diagnosis not present

## 2019-07-20 NOTE — Telephone Encounter (Signed)
   [facility] Pre-operative Risk Assessment    HEARTCARE STAFF: - Please ensure there is not already an duplicate clearance open for this procedure - Under Visit Info/Reason for Call, type in Other and utilize the format Clearance MM/DD/YY or Clearance TBD  Request for surgical clearance:  1. What type of surgery is being performed?  Left L4-5 L5-S1  2. When is this surgery scheduled? 08/23/19  3. What type of clearance is required (medical clearance vs. Pharmacy clearance to hold med vs. Both)? Both  4. Are there any medications that need to be held prior to surgery and how long? Aspirin   5.   Practice name and name of physician performing surgery? [facility]   Dr.Joseph Venetia Maxon  5. What is the office phone number? [phone]   7.   What is the office fax number? [phone]  8.   Anesthesia type General   Neoma Laming 07/20/2019, 5:17 PM  _________________________________________________________________   (provider comments below)

## 2019-07-21 NOTE — Telephone Encounter (Signed)
   Primary Cardiologist:Mihai Croitoru, MD  Chart reviewed as part of pre-operative protocol coverage. Because of Mark Williams's past medical history and time since last visit, he/she will require a follow-up visit in order to better assess preoperative cardiovascular risk.  Pre-op covering staff: - Please schedule appointment and call patient to inform them. - Please contact requesting surgeon's office via preferred method (i.e, phone, fax) to inform them of need for appointment prior to surgery.  If applicable, this message will also be routed to pharmacy pool and/or primary cardiologist for input on holding anticoagulant/antiplatelet agent as requested below so that this information is available at time of patient's appointment.   Mark Chard, NP  07/21/2019, 8:37 AM

## 2019-07-21 NOTE — Telephone Encounter (Signed)
I left message for the pt to please call the office to schedule a pre op clearance appt with Dr. Royann Shivers or APP at the [facility] office.

## 2019-07-22 NOTE — Telephone Encounter (Signed)
Pt has appt 08/02/19 with Joni Reining, DNP. I will forward clearance notes to DNP for upcoming appt. I will send FYI to the surgeon Dr. Maeola Harman pt has appt 08/02/19. I will remove from the pre op call back pool.

## 2019-07-31 NOTE — Progress Notes (Signed)
Cardiology Office Note   Date:  08/02/2019   ID:  Mark Williams, DOB 02/08/1951, MRN [id number]  PCP:  Mark Deiters, MD  Cardiologist: Dr.  Royann Williams CC: Pre-Op Evaluation    History of Present Illness: Mark Williams is a 69 y.o. male who presents for preoperative evaluation after not being seen in the office for 5 years, last seen on 10/04/2014 by Dr. Royann Williams.  He is planned to have surgery to his L4-5, L5-S1 on 08/23/2019 by [facility], Dr. Maeola Williams.  He was being followed by Dr. Royann Williams in the setting of CAD status post inferior wall STEMI with drug-eluting stent to the distal RCA in June 2015.  The patient had 2 drug-eluting stents lateral being at the site of the culprit lesion.  He had residual 70% stenosis in a relatively small ramus intermedius artery and scattered 20% to 30% lesions in the LAD artery.    At the time he was continuing to smoke and was finding it hard to quit.  He is also being treated for hyperlipidemia.  On the last office visit as he had been on Brilinta for over a year, this was discontinued but he was continued on aspirin.  Lisinopril dose was reduced to 10 mg daily, he was counseled on smoking cessation.  He denies recurrent chest pain or significant shortness of breath.  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  He denies significant fatigue.  He unfortunately continues to smoke a pack a day of cigarettes.  Labs are completed by PCP and have recently been done over the last 3 months.  He has been medically compliant.   Past Medical History:  Diagnosis Date  . 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 Hyperlipidemia LDL goal < 70 08/04/2013  . STEMI (ST elevation myocardial infarction) (HCC) 08/02/13  . Tobacco abuse 08/04/2013    Past Surgical History:  Procedure Laterality Date  . CORONARY  ANGIOPLASTY WITH STENT PLACEMENT  08/2013   Xience DES to [facility] for STEMI  . LEFT HEART CATHETERIZATION WITH CORONARY ANGIOGRAM N/A 08/02/2013   Procedure: LEFT HEART CATHETERIZATION WITH CORONARY ANGIOGRAM;  Surgeon: Mark Bihari, MD;  Location: [facility] CATH LAB;  Service: Cardiovascular;  Laterality: N/A;  . PERCUTANEOUS CORONARY STENT INTERVENTION (PCI-S)  08/02/2013   Procedure: PERCUTANEOUS CORONARY STENT INTERVENTION (PCI-S);  Surgeon: Mark Bihari, MD;  Location: [facility] CATH LAB;  Service: Cardiovascular;;     Current Outpatient Medications  Medication Sig Dispense Refill  . aspirin EC 81 MG EC tablet Take 1 tablet (81 mg total) by mouth daily.    [name] atorvastatin (LIPITOR) 80 MG tablet TAKE 1 TABLET (80 MG TOTAL) BY MOUTH DAILY AT 6 PM. 30 tablet 7  . diclofenac (VOLTAREN) 75 MG EC tablet Take 75 mg by mouth 2 (two) times daily.    [name] donepezil (ARICEPT) 10 MG tablet Take 10 mg by mouth daily.    [name] gabapentin (NEURONTIN) 400 MG capsule     . lisinopril (PRINIVIL,ZESTRIL) 10 MG tablet TAKE 1 TABLET (10 MG TOTAL) BY MOUTH DAILY. (Patient taking differently: 20 mg. ) 30 tablet 6  . metoprolol tartrate (LOPRESSOR) 25 MG tablet TAKE 0.5 TABLETS (12.5 MG TOTAL) BY MOUTH 2 (TWO) TIMES DAILY. 30 tablet 7  . nitroGLYCERIN (NITROSTAT) 0.4 MG SL tablet Place 1 tablet (0.4 mg total) under the tongue every  5 (five) minutes x 3 doses as needed for chest pain. 25 tablet 4  . VIAGRA 100 MG tablet Take 1 tablet by mouth as needed.     No current facility-administered medications for this visit.    Allergies:   Patient has no known allergies.    Social History:  The patient  reports that he quit smoking about 6 years ago. His smoking use included cigarettes. He started smoking about 50 years ago. He has a 43.00 pack-year smoking history. He has never used smokeless tobacco. He reports that he does not drink alcohol or use drugs.   Family History:  The patient's family history includes AAA (abdominal aortic  aneurysm) in his father; Cancer in his mother; Healthy in his brother, brother, brother, brother, brother, brother, brother, brother, sister, sister, sister, sister, and sister.    ROS: All other systems are reviewed and negative. Unless otherwise mentioned in H&P    PHYSICAL EXAM: VS:  BP (!) 151/85   Pulse (!) 54   Ht 5\' 7"  (1.702 m)   Wt 149 lb (67.6 kg)   SpO2 98%   BMI 23.34 kg/m  , BMI Body mass index is 23.34 kg/m. GEN: Well nourished, well developed, in no acute distress HEENT: normal Neck: no JVD, carotid bruits, or masses Cardiac: RRR; bradycardic, no murmurs, rubs, or gallops,no edema  Respiratory: Expiratory wheezing with some mild bibasilar crackles  MS: no deformity or atrophy Skin: warm and dry, no rash Neuro:  Strength and sensation are intact Psych: euthymic mood, full affect   EKG: Sinus bradycardia with sinus arrhythmia heart rate of 54 bpm.  (Personally reviewed)  Recent Labs: No results found for requested labs within last 8760 hours.    Lipid Panel    Component Value Date/Time   CHOL 175 08/03/2013 0123   TRIG 152 (H) 08/03/2013 0123   HDL 42 08/03/2013 0123   CHOLHDL 4.2 08/03/2013 0123   VLDL 30 08/03/2013 0123   LDLCALC 103 (H) 08/03/2013 0123      Wt Readings from Last 3 Encounters:  08/02/19 149 lb (67.6 kg)  06/13/19 156 lb (70.8 kg)  04/22/19 153 lb (69.4 kg)      Other studies Reviewed: See above   ASSESSMENT AND PLAN:  1.  Preoperative cardiology assessment: With known history of CAD, age, hypercholesterolemia, ongoing smoking, I will schedule him for nuclear medicine stress test to evaluate for new areas of ischemia prior to allowing him to proceed with surgery.  I have explained this test to him.  Due to chronic left leg pain and associated lumbar spine pain I have offered Lexiscan Myoview.  He would like to try walking on the treadmill first but knows that he has the option of having this test instead.  He verbalizes  understanding concerning the explanation of the test.   2.  Hypertension: Elevated today.  He is just finished smoking a cigarette.  He will continue on lisinopril and metoprolol.  If remains elevated would recommend increasing lisinopril to 10 mg daily.  3.  Hyperlipidemia: Remains on atorvastatin 80 mg daily with goal of LDL less than 70.  He states he has recently had labs drawn by his PCP.  We will try and request these records  Current medicines are reviewed at length with the patient today.  I have spent  25 minutes dedicated to the care of this patient on the date of this encounter to include pre-visit review of records, assessment, management and diagnostic testing,with shared  decision making.  Labs/ tests ordered today include: Nuclear medicine stress test-option for Lexiscan if required.  Phill Myron. West Pugh, ANP, AACC   08/02/2019 12:57 PM    [facility] [place] [address] Office [phone] Fax [phone]  Notice: This dictation was prepared with Dragon dictation along with smaller phrase technology. Any transcriptional errors that result from this process are unintentional and may not be corrected upon review.

## 2019-08-02 ENCOUNTER — Other Ambulatory Visit: Payer: Self-pay

## 2019-08-02 ENCOUNTER — Ambulatory Visit: Payer: PPO | Admitting: Adult Health

## 2019-08-02 ENCOUNTER — Encounter: Payer: Self-pay | Admitting: Adult Health

## 2019-08-02 VITALS — BP 151/85 | HR 54 | Ht 67.0 in | Wt 149.0 lb

## 2019-08-02 DIAGNOSIS — E7800 Pure hypercholesterolemia, unspecified: Secondary | ICD-10-CM | POA: Diagnosis not present

## 2019-08-02 DIAGNOSIS — Z01810 Encounter for preprocedural cardiovascular examination: Secondary | ICD-10-CM | POA: Diagnosis not present

## 2019-08-02 DIAGNOSIS — R002 Palpitations: Secondary | ICD-10-CM | POA: Diagnosis not present

## 2019-08-02 DIAGNOSIS — I2510 Atherosclerotic heart disease of native coronary artery without angina pectoris: Secondary | ICD-10-CM

## 2019-08-02 NOTE — Patient Instructions (Signed)
Medication Instructions:  Continue current medications  *If you need a refill on your cardiac medications before your next appointment, please call your pharmacy*   Lab Work: None Ordered   Testing/Procedures: Your physician has requested that you have en exercise stress myoview. For further information please visit [email or web address] Please follow instruction sheet, as given.  Follow-Up: At [facility], you and your health needs are our priority.  As part of our continuing mission to provide you with exceptional heart care, we have created designated Provider Care Teams.  These Care Teams include your primary Cardiologist (physician) and Advanced Practice Providers (APPs -  Physician Assistants and Nurse Practitioners) who all work together to provide you with the care you need, when you need it.  We recommend signing up for the patient portal called "MyChart".  Sign up information is provided on this After Visit Summary.  MyChart is used to connect with patients for Virtual Visits (Telemedicine).  Patients are able to view lab/test results, encounter notes, upcoming appointments, etc.  Non-urgent messages can be sent to your provider as well.   To learn more about what you can do with MyChart, go to [email or web address].    Your next appointment:   3 month(s)  The format for your next appointment:   In Person  Provider:   You may see [name], MD or one of the following Advanced Practice Providers on your designated Care Team:    Azalee Course, PA-C  Micah Flesher, PA-C or   Judy Pimple, [place]

## 2019-08-04 ENCOUNTER — Telehealth (HOSPITAL_COMMUNITY): Payer: Self-pay

## 2019-08-04 NOTE — H&P (Signed)
Patient ID:   [id number] Patient: Mark Williams  Date of Birth: [birth year] Visit Type: Office Visit   Date: 07/20/2019 10:30 AM Provider: Danae Orleans. Venetia Maxon MD   This 69 year old male presents for back pain.  HISTORY OF PRESENT ILLNESS: 1.  back pain  Mark Williams, 69 year old retired male, visits for evaluation of left buttock and leg pain.  Patient reports symptoms increasing over the past year.  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  Diclofenac 75 mg b.i.d. Gabapentin 400 mg q.i.d.  ESI x6 offered significant relief x2 months  History:  HTN, MI 2015, hyperlipidemia, CAD Surgical history:  Heart stent x2 2015  Lumbar MRI and x-ray on canopy  The patient has pain in his left leg to his foot and to his big toe.  He says his back is not severely painful.  He says the pain has been going on for greater than 1 year.  The patient smokes 1 pack per day of cigarettes.  I discussed with him the need to stop smoking.  Lumbar imaging demonstrates severe foraminal stenosis on the left at the L4-5 and L5-S1 levels with disc height loss and disc degeneration at both levels with nerve compression of both descending and exiting nerve roots  The patient has 9/10 pain       PAST MEDICAL/SURGICAL HISTORY:   (Detailed)   Disease/disorder Onset Date Management Date Comments Hypertension     Myocardial infarction       2 stints in heart, 2016      PAST MEDICAL HISTORY, SURGICAL HISTORY, FAMILY HISTORY, SOCIAL HISTORY AND REVIEW OF SYSTEMS I have reviewed the patient's past medical, surgical, family and social history as well as the comprehensive review of systems as included on the [facility] history form dated 07/20/2019, which I have signed.  Family History:  (Detailed)   Social History:  (Detailed) Tobacco use reviewed. Preferred language is [place].   Tobacco use status: Heavy  cigarette smoker (20-39 cigs/day). Smoking status: Heavy tobacco smoker.  SMOKING STATUS Type Smoking Status Usage Per Day Years Used Total Pack Years Cigarette Heavy tobacco smoker 1 Packs 30 30      MEDICATIONS: (added, continued or stopped this visit) Started Medication Directions Instruction Stopped  atorvastatin 80 mg tablet take 1 tablet by oral route  every day    Bayer Aspirin take 1 tablet by oral route  every day    diclofenac sodium 75 mg tablet,delayed release take 1 tablet by oral route 2 times every day    donepezil 10 mg tablet take 1 tablet by oral route every day    gabapentin 400 mg capsule take 1 capsule by oral route 4 times every day    lisinopril 20 mg tablet take 1 tablet by oral route  every day    metoprolol succinate ER 25 mg tablet,extended release 24 hr take 1/2 tablet by oral route 2 times every day      ALLERGIES: Ingredient Reaction Medication Name Comment NO KNOWN ALLERGIES    No known allergies. Reviewed, updated.   REVIEW OF SYSTEMS  See scanned patient registration form, dated 07/20/2019, signed and dated on 07/20/2019  Review of Systems Details System Neg/Pos Details Constitutional Negative Chills, Fatigue, Fever, Malaise, Night sweats, Weight gain and Weight loss. ENMT Negative Ear drainage, Hearing loss, Nasal drainage, Otalgia, Sinus pressure and Sore throat. Eyes Negative Eye discharge, Eye pain and Vision changes. Respiratory Negative  Chronic cough, Cough, Dyspnea, Known TB exposure and Wheezing. Cardio Negative Chest pain, Claudication, Edema and Irregular heartbeat/palpitations. GI Negative Abdominal pain, Blood in stool, Change in stool pattern, Constipation, Decreased appetite, Diarrhea, Heartburn, Nausea and Vomiting. GU Negative Dribbling, Dysuria, Erectile dysfunction, Hematuria, Polyuria (Genitourinary), Slow stream, Urinary frequency, Urinary incontinence and  Urinary retention. Endocrine Negative Cold intolerance, Heat intolerance, Polydipsia and Polyphagia. Neuro Negative Dizziness, Extremity weakness, Gait disturbance, Headache, Memory impairment, Numbness in extremity, Seizures and Tremors. Psych Negative Anxiety, Depression and Insomnia. Integumentary Negative Brittle hair, Brittle nails, Change in shape/size of mole(s), Hair loss, Hirsutism, Hives, Pruritus, Rash and Skin lesion. MS Positive Left buttock and leg pain. Hema/Lymph Negative Easy bleeding, Easy bruising and Lymphadenopathy. Allergic/Immuno Negative Contact allergy, Environmental allergies, Food allergies and Seasonal allergies. Reproductive Negative Penile discharge and Sexual dysfunction.  PHYSICAL EXAM:  Vitals Date Temp F BP Pulse Ht In Wt Lb BMI BSA Pain Score 07/20/2019  121/71 53 67 151.4 23.71  9/10   PHYSICAL EXAM Details General Level of Distress: no acute distress Overall Appearance: normal  Head and Face  Right Left  Fundoscopic Exam:  normal normal    Cardiovascular Cardiac: regular rate and rhythm without murmur  Right Left  Carotid Pulses: normal normal  Respiratory Lungs: clear to auscultation  Neurological Orientation: normal Recent and Remote Memory: normal Attention Span and Concentration:   normal Language: normal Fund of Knowledge: normal  Right Left Sensation: normal normal Upper Extremity Coordination: normal normal  Lower Extremity Coordination: normal normal  Musculoskeletal Gait and Station: normal  Right Left Upper Extremity Muscle Strength: normal normal Lower Extremity Muscle Strength: normal normal Upper Extremity Muscle Tone:  normal normal Lower Extremity Muscle Tone: normal normal   Motor Strength Upper and lower extremity motor strength was tested in the clinically pertinent muscles. Any abnormal findings will be noted below.   Right Left EHL:  4/5   Deep Tendon  Reflexes  Right Left Biceps: normal normal Triceps: normal normal Brachioradialis: normal normal Patellar: normal normal Achilles: normal normal  Sensory Sensation was tested at L1 to S1. Any abnormal findings will be noted below.  Right Left L5:  decreased   Cranial Nerves II. Optic Nerve/Visual Fields: normal III. Oculomotor: normal IV. Trochlear: normal V. Trigeminal: normal VI. Abducens: normal VII. Facial: normal VIII. Acoustic/Vestibular: normal IX. Glossopharyngeal: normal X. Vagus: normal XI. Spinal Accessory: normal XII. Hypoglossal: normal  Motor and other Tests Lhermittes: negative Rhomberg: negative Pronator drift: absent     Right Left Hoffman's: normal normal Clonus: normal normal Babinski: normal normal SLR: negative positive at 45 degrees Patrick's Pearlean Brownie): negative negative Toe Walk: normal normal Toe Lift: normal normal Heel Walk: normal normal SI Joint: nontender nontender   Additional Findings:  Severe left sciatic notch discomfort to palpation.  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  This is refractory to conservative management.  I have recommended proceeding with left L4-5 and left L5-S1 TLIF with pedicle screw fixation following cardiac clearance and have advised the patient to stop smoking  PLAN: Proceed with surgery when cleared medically  Orders: Diagnostic Procedures: Assessment Procedure M54.16 Lumbar Spine- AP/Lat M54.16 Lumbar Spine- AP/Lat/Flex/Ex  Completed Orders (this encounter) Order Details Reason Side Interpretation Result Initial Treatment Date Region Lumbar Spine- AP/Lat/Flex/Ex      07/20/2019 All Levels to All Levels  Assessment/Plan  # Detail Type Description  1. Assessment  Disc displacement, lumbar (M51.26).     2. Assessment Low back pain, unspecified back pain  laterality, with sciatica presence unspecified (M54.5).     3. Assessment Lumbar radiculopathy (M54.16).     4. Assessment Lumbar foraminal stenosis (M48.061).       Pain Management Plan Pain Scale: 9/10. Method: Numeric Pain Intensity Scale. Location: back. Onset: 07/20/2019. Duration: varies. Quality: discomforting. Pain management follow-up plan of care: Patient will continue medication management..              Provider:  Marchia Meiers. Vertell Limber MD  07/25/2019 08:09 AM    Dictation edited by: Marchia Meiers. Vertell Limber    CC Providers: Stoney Bang [place],  [place]  [id number]   [address]  [place], [place] [id number]-               Electronically signed by Marchia Meiers Vertell Limber MD on 07/25/2019 08:09 AM

## 2019-08-04 NOTE — Telephone Encounter (Signed)
Encounter complete. 

## 2019-08-05 DIAGNOSIS — E7849 Other hyperlipidemia: Secondary | ICD-10-CM | POA: Diagnosis not present

## 2019-08-05 DIAGNOSIS — Z6824 Body mass index (BMI) 24.0-24.9, adult: Secondary | ICD-10-CM | POA: Diagnosis not present

## 2019-08-05 DIAGNOSIS — F0150 Vascular dementia without behavioral disturbance: Secondary | ICD-10-CM | POA: Diagnosis not present

## 2019-08-05 DIAGNOSIS — I10 Essential (primary) hypertension: Secondary | ICD-10-CM | POA: Diagnosis not present

## 2019-08-06 ENCOUNTER — Other Ambulatory Visit (HOSPITAL_COMMUNITY)
Admission: RE | Admit: 2019-08-06 | Discharge: 2019-08-06 | Disposition: A | Discharge status: Home / Self Care | Payer: PPO | Source: Ambulatory Visit | Attending: Neurosurgery | Admitting: Neurosurgery

## 2019-08-06 DIAGNOSIS — Z20822 Contact with and (suspected) exposure to covid-19: Secondary | ICD-10-CM | POA: Insufficient documentation

## 2019-08-06 DIAGNOSIS — Z01812 Encounter for preprocedural laboratory examination: Secondary | ICD-10-CM | POA: Insufficient documentation

## 2019-08-06 LAB — SARS CORONAVIRUS 2 (TAT 6-24 HRS): SARS Coronavirus 2: NEGATIVE

## 2019-08-10 ENCOUNTER — Encounter: Payer: Self-pay | Admitting: Cardiovascular Disease

## 2019-08-10 ENCOUNTER — Other Ambulatory Visit: Payer: Self-pay

## 2019-08-10 ENCOUNTER — Ambulatory Visit (HOSPITAL_COMMUNITY)
Admission: RE | Admit: 2019-08-10 | Discharge: 2019-08-10 | Disposition: A | Discharge status: Home / Self Care | Payer: PPO | Source: Ambulatory Visit | Attending: Internal Medicine | Admitting: Internal Medicine

## 2019-08-10 DIAGNOSIS — I2510 Atherosclerotic heart disease of native coronary artery without angina pectoris: Secondary | ICD-10-CM

## 2019-08-15 ENCOUNTER — Telehealth (HOSPITAL_COMMUNITY): Payer: Self-pay

## 2019-08-15 NOTE — Telephone Encounter (Signed)
Spoke with the patient, he stated he would be here for his test and he understood. He stated that he would be here for his test. Asked to call back with any questions. S.Xiana Carns EMTP

## 2019-08-16 ENCOUNTER — Ambulatory Visit (HOSPITAL_COMMUNITY): Payer: PPO | Attending: Adult Health

## 2019-08-16 ENCOUNTER — Other Ambulatory Visit: Payer: Self-pay

## 2019-08-16 VITALS — Ht 67.0 in | Wt 149.0 lb

## 2019-08-16 DIAGNOSIS — I2510 Atherosclerotic heart disease of native coronary artery without angina pectoris: Secondary | ICD-10-CM

## 2019-08-16 LAB — MYOCARDIAL PERFUSION IMAGING
LV dias vol: 92 mL (ref 62–150)
LV sys vol: 42 mL
Peak HR: 95 {beats}/min
Rest HR: 56 {beats}/min
SDS: 0
SRS: 0
SSS: 0
TID: 1.09

## 2019-08-16 MED ORDER — TECHNETIUM TC 99M TETROFOSMIN IV KIT
31.6000 | PACK | Freq: Once | INTRAVENOUS | Status: AC | PRN
  Administered 2019-08-16: 31.6 via INTRAVENOUS
  Filled 2019-08-16: qty 32

## 2019-08-16 MED ORDER — REGADENOSON 0.4 MG/5ML IV SOLN
0.4000 mg | Freq: Once | INTRAVENOUS | Status: AC
  Administered 2019-08-16: 0.4 mg via INTRAVENOUS

## 2019-08-16 MED ORDER — TECHNETIUM TC 99M TETROFOSMIN IV KIT
10.3000 | PACK | Freq: Once | INTRAVENOUS | Status: AC | PRN
  Administered 2019-08-16: 10.3 via INTRAVENOUS
  Filled 2019-08-16: qty 11

## 2019-08-17 ENCOUNTER — Telehealth: Payer: Self-pay | Admitting: Adult Health

## 2019-08-17 NOTE — Telephone Encounter (Signed)
New Message   Patient is calling to obtain results of lexiscan

## 2019-08-17 NOTE — Telephone Encounter (Signed)
The patient has been notified of the result and verbalized understanding.  All questions (if any) were answered. Leanord Hawking, RN 08/17/2019 12:07 PM

## 2019-08-18 ENCOUNTER — Encounter (HOSPITAL_COMMUNITY): Payer: Self-pay

## 2019-08-18 NOTE — Progress Notes (Signed)
[facility], [place] - [address] [place] [address] Phone: [phone] Fax: [phone]    Your procedure is scheduled on Tuesday, June 22nd.  Report to [facility] Main Entrance "A" at 5:30 A.M., and check in at the Admitting office.  Call this number if you have problems the morning of surgery:  [phone]  Call [phone] if you have any questions prior to your surgery date Monday-Friday 8am-4pm   Remember:  Do not eat or drink after midnight the night before your surgery.    Take these medicines the morning of surgery with A SIP OF WATER  CHANTIX  donepezil (ARICEPT)  gabapentin (NEURONTIN)  metoprolol tartrate (LOPRESSOR)  If needed - nitroGLYCERIN (NITROSTAT)  Follow your surgeon's instructions on when to stop Aspirin.  If no instructions were given by your surgeon then you will need to call the office to get those instructions.    As of today, STOP taking diclofenac (VOLTAREN), Aspirin containing products, Aleve, Naproxen, Ibuprofen, Motrin, Advil, Goody's, BC's, all herbal medications, fish oil, and all vitamins.             Do not wear jewelry.            Do not wear lotions, powders, colognes, or deodorant.            Men may shave face and neck.            Do not bring valuables to the hospital.            [facility] is not responsible for any belongings or valuables.  Do NOT Smoke (Tobacco/Vapping) or drink Alcohol 24 hours prior to your procedure If you use a CPAP at night, you may bring all equipment for your overnight stay.   Contacts, glasses, dentures or bridgework may not be worn into surgery.      For patients admitted to the hospital, discharge time will be determined by your treatment team.   Patients discharged the day of surgery will not be allowed to drive home, and someone needs to stay with them for 24 hours.  Special instructions:   [place]- Preparing For Surgery  Before surgery, you can play  an important role. Because skin is not sterile, your skin needs to be as free of germs as possible. You can reduce the number of germs on your skin by washing with CHG (chlorahexidine gluconate) Soap before surgery.  CHG is an antiseptic cleaner which kills germs and bonds with the skin to continue killing germs even after washing.    Oral Hygiene is also important to reduce your risk of infection.  Remember - BRUSH YOUR TEETH THE MORNING OF SURGERY WITH YOUR REGULAR TOOTHPASTE  Please do not use if you have an allergy to CHG or antibacterial soaps. If your skin becomes reddened/irritated stop using the CHG.  Do not shave (including legs and underarms) for at least 48 hours prior to first CHG shower. It is OK to shave your face.  Please follow these instructions carefully.   1. Shower the NIGHT BEFORE SURGERY and the MORNING OF SURGERY with CHG Soap.   2. If you chose to wash your hair, wash your hair first as usual with your normal shampoo.  3. After you shampoo, rinse your hair and body thoroughly to remove the shampoo.  4. Use CHG as you would any other liquid soap. You can apply CHG directly to the skin and wash gently with  a scrungie or a clean washcloth.   5. Apply the CHG Soap to your body ONLY FROM THE NECK DOWN.  Do not use on open wounds or open sores. Avoid contact with your eyes, ears, mouth and genitals (private parts). Wash Face and genitals (private parts)  with your normal soap.   6. Wash thoroughly, paying special attention to the area where your surgery will be performed.  7. Thoroughly rinse your body with warm water from the neck down.  8. DO NOT shower/wash with your normal soap after using and rinsing off the CHG Soap.  9. Pat yourself dry with a CLEAN TOWEL.  10. Wear CLEAN PAJAMAS to bed the night before surgery, wear comfortable clothes the morning of surgery  11. Place CLEAN SHEETS on your bed the night of your first shower and DO NOT SLEEP WITH PETS.  Day of  Surgery: Shower with CHG soap as instructed above.  Do not apply any deodorants/lotions.  Please wear clean clothes to the hospital/surgery center.   Remember to brush your teeth WITH YOUR REGULAR TOOTHPASTE.   Please read over the following fact sheets that you were given.

## 2019-08-19 ENCOUNTER — Encounter (HOSPITAL_COMMUNITY): Payer: Self-pay

## 2019-08-19 ENCOUNTER — Other Ambulatory Visit: Payer: Self-pay

## 2019-08-19 ENCOUNTER — Other Ambulatory Visit (HOSPITAL_COMMUNITY)
Admission: RE | Admit: 2019-08-19 | Discharge: 2019-08-19 | Disposition: A | Discharge status: Home / Self Care | Payer: PPO | Source: Ambulatory Visit | Attending: Neurosurgery | Admitting: Neurosurgery

## 2019-08-19 ENCOUNTER — Inpatient Hospital Stay (HOSPITAL_COMMUNITY): Admission: RE | Admit: 2019-08-19 | Payer: PPO | Source: Ambulatory Visit

## 2019-08-19 ENCOUNTER — Encounter (HOSPITAL_COMMUNITY)
Admission: RE | Admit: 2019-08-19 | Discharge: 2019-08-19 | Disposition: A | Discharge status: Home / Self Care | Payer: PPO | Source: Ambulatory Visit | Attending: Neurosurgery | Admitting: Neurosurgery

## 2019-08-19 DIAGNOSIS — Z20822 Contact with and (suspected) exposure to covid-19: Secondary | ICD-10-CM | POA: Diagnosis not present

## 2019-08-19 DIAGNOSIS — Z01812 Encounter for preprocedural laboratory examination: Secondary | ICD-10-CM | POA: Diagnosis not present

## 2019-08-19 HISTORY — DX: Essential (primary) hypertension: I10

## 2019-08-19 LAB — CBC
HCT: 40.9 % (ref 39.0–52.0)
Hemoglobin: 13.9 g/dL (ref 13.0–17.0)
MCH: 32.7 pg (ref 26.0–34.0)
MCHC: 34 g/dL (ref 30.0–36.0)
MCV: 96.2 fL (ref 80.0–100.0)
Platelets: 225 10*3/uL (ref 150–400)
RBC: 4.25 MIL/uL (ref 4.22–5.81)
RDW: 12.9 % (ref 11.5–15.5)
WBC: 8.3 10*3/uL (ref 4.0–10.5)
nRBC: 0 % (ref 0.0–0.2)

## 2019-08-19 LAB — BASIC METABOLIC PANEL
Anion gap: 7 (ref 5–15)
BUN: 26 mg/dL — ABNORMAL HIGH (ref 8–23)
CO2: 28 mmol/L (ref 22–32)
Calcium: 9.4 mg/dL (ref 8.9–10.3)
Chloride: 101 mmol/L (ref 98–111)
Creatinine, Ser: 1.12 mg/dL (ref 0.61–1.24)
GFR calc Af Amer: >60 mL/min (ref >60)
GFR calc non Af Amer: >60 mL/min (ref >60)
Glucose, Bld: 86 mg/dL (ref 70–99)
Potassium: 4.3 mmol/L (ref 3.5–5.1)
Sodium: 136 mmol/L (ref 135–145)

## 2019-08-19 LAB — SURGICAL PCR SCREEN
MRSA, PCR: NEGATIVE
Staphylococcus aureus: POSITIVE — AB

## 2019-08-19 LAB — TYPE AND SCREEN
ABO/RH(D): O POS
Antibody Screen: NEGATIVE

## 2019-08-19 LAB — ABO/RH: ABO/RH(D): O POS

## 2019-08-19 NOTE — Progress Notes (Signed)
PCP: Dr. Olena Leatherwood Cardiologist: Dr. Royann Shivers  EKG: 08/02/2019 CXR: denies ECHO: denies Stress Test: 08/16/19 Cardiac Cath: 08/02/13  Pt took BP meds 45 mins prior to appt time, left appt before BP recheck.  Patient denies shortness of breath, fever, cough, and chest pain at PAT appointment.  Patient verbalized understanding of instructions provided today at the PAT appointment.  Patient asked to review instructions at home and day of surgery.

## 2019-08-20 LAB — SARS CORONAVIRUS 2 (TAT 6-24 HRS): SARS Coronavirus 2: NEGATIVE

## 2019-08-22 NOTE — Anesthesia Preprocedure Evaluation (Addendum)
Anesthesia Evaluation  Patient identified by MRN, date of birth, ID band Patient awake    Reviewed: Allergy & Precautions, Patient's Chart, lab work & pertinent test results  History of Anesthesia Complications Negative for: history of anesthetic complications  Airway Mallampati: I  TM Distance: >3 FB Neck ROM: Full    Dental  (+) Edentulous Upper, Edentulous Lower, Dental Advisory Given   Pulmonary former smoker,    Pulmonary exam normal        Cardiovascular hypertension, Pt. on medications and Pt. on home beta blockers + CAD, + Past MI and + Cardiac Stents  Normal cardiovascular exam  CV:  Nuclear stress test 08/16/19:   The left ventricular ejection fraction is mildly decreased (45-54%).  Nuclear stress EF: 54%.  There was no ST segment deviation noted during stress.  The study is normal.  This is a low risk study.   Cardiac cath 08/03/13:  - Acute inferior wall STEMI secondary to total distal occlusion of the RCA - 20% proximal LAD stenosis and 30% irregularities of the diagonal vessel - 70% eccentric stenosis in the proximal ramus intermediate/high marginal vessel - Distal RCA occlusion successfully treated with PTCA/stenting and DES  and stenting of the proximal RCA      Neuro/Psych    GI/Hepatic negative GI ROS, Neg liver ROS,   Endo/Other  negative endocrine ROS  Renal/GU negative Renal ROS     Musculoskeletal negative musculoskeletal ROS (+)   Abdominal   Peds  Hematology negative hematology ROS (+)   Anesthesia Other Findings   Reproductive/Obstetrics                         Anesthesia Physical Anesthesia Plan  ASA: III  Anesthesia Plan: General   Post-op Pain Management:    Induction: Intravenous  PONV Risk Score and Plan: 3 and Ondansetron, Dexamethasone and Midazolam  Airway Management Planned: Oral ETT  Additional Equipment:   Intra-op Plan:    Post-operative Plan: Extubation in OR  Informed Consent: I have reviewed the patients History and Physical, chart, labs and discussed the procedure including the risks, benefits and alternatives for the proposed anesthesia with the patient or authorized representative who has indicated his/her understanding and acceptance.     Dental advisory given  Plan Discussed with: Anesthesiologist and CRNA  Anesthesia Plan Comments: (See APP note by Joslyn Hy, FNP)      Anesthesia Quick Evaluation

## 2019-08-22 NOTE — Progress Notes (Signed)
Anesthesia Chart Review:   Case: [id number] Date/Time: 08/23/19 0715   Procedure: Left Lumbar 4-5 Lumbar 5-Sacral 1 Transforaminal lumbar interbody fusion (Left Back) - 3c   Anesthesia type: General   Pre-op diagnosis: Lumbar foraminal stenosis   Location: [facility] OR ROOM 21 / [facility]   Surgeons: Erline Levine, MD      DISCUSSION:  Pt is a 69 year old with hx CAD (s/p STEMI/ DES x2 to RCA 2015), HTN. Current smoker.   Pt cleared for surgery after low risk stress test 08/16/19   VS: BP (!) 161/84   Pulse (!) 57   Temp 36.9 C (Oral)   Resp 18   Ht 5\' 7"  (1.702 m)   Wt 68.8 kg   SpO2 100%   BMI 23.76 kg/m    PROVIDERS: - PCP is  Neale Burly, MD - Cardiologist is Sanda Klein, MD. Last office visit 08/02/19 with Jory Sims, NP. Cleared for surgery by Ms. Lawrence, NP in comment on stress test result 08/16/19   LABS: Labs reviewed: Acceptable for surgery. (all labs ordered are listed, but only abnormal results are displayed)  Labs Reviewed  SURGICAL PCR SCREEN - Abnormal; Notable for the following components:      Result Value   Staphylococcus aureus POSITIVE (*)    All other components within normal limits  BASIC METABOLIC PANEL - Abnormal; Notable for the following components:   BUN 26 (*)    All other components within normal limits  SARS CORONAVIRUS 2 (TAT 6-24 HRS)  CBC  TYPE AND SCREEN  ABO/RH    EKG 08/02/19: Sinus bradycardia with sinus arrhythmia   CV:  Nuclear stress test 08/16/19:   The left ventricular ejection fraction is mildly decreased (45-54%).  Nuclear stress EF: 54%.  There was no ST segment deviation noted during stress.  The study is normal.  This is a low risk study.   Cardiac cath 08/03/13:  - Acute inferior wall STEMI secondary to total distal occlusion of the RCA - 20% proximal LAD stenosis and 30% irregularities of the diagonal vessel - 70% eccentric stenosis in the proximal ramus intermediate/high marginal vessel - Distal RCA  occlusion successfully treated with PTCA/stenting and DES  and stenting of the proximal RCA    Past Medical History:  Diagnosis Date  . 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 Hyperlipidemia LDL goal < 70 08/04/2013  . Hypertension   . STEMI (ST elevation myocardial infarction) ([place]) 08/02/13  . Tobacco abuse 08/04/2013    Past Surgical History:  Procedure Laterality Date  . CORONARY ANGIOPLASTY WITH STENT PLACEMENT  08/2013   Xience DES to [facility] for STEMI  . LEFT HEART CATHETERIZATION WITH CORONARY ANGIOGRAM N/A 08/02/2013   Procedure: LEFT HEART CATHETERIZATION WITH CORONARY ANGIOGRAM;  Surgeon: Troy Sine, MD;  Location: [facility] CATH LAB;  Service: Cardiovascular;  Laterality: N/A;  . PERCUTANEOUS CORONARY STENT INTERVENTION (PCI-S)  08/02/2013   Procedure: PERCUTANEOUS CORONARY STENT INTERVENTION (PCI-S);  Surgeon: Troy Sine, MD;  Location: [facility] CATH LAB;  Service: Cardiovascular;;    MEDICATIONS: . aspirin EC 81 MG EC tablet  . atorvastatin (LIPITOR) 80 MG tablet  . CHANTIX 0.5 MG tablet  . diclofenac (VOLTAREN) 75 MG EC tablet  . donepezil (ARICEPT) 10 MG tablet  . gabapentin (NEURONTIN) 400 MG capsule  . lisinopril (ZESTRIL) 20 MG tablet  . metoprolol tartrate (LOPRESSOR) 25 MG tablet  .  Multiple Vitamins-Minerals (MULTIVITAMIN WITH MINERALS) tablet  . nitroGLYCERIN (NITROSTAT) 0.4 MG SL tablet  . VIAGRA 100 MG tablet   No current facility-administered medications for this encounter.    If no changes, I anticipate pt can proceed with surgery as scheduled.   Rica Mast, FNP-BC [facility] Stay Surgical [facility]/Anesthesiology Phone: [phone] 08/22/2019 10:24 AM

## 2019-08-23 ENCOUNTER — Inpatient Hospital Stay (HOSPITAL_COMMUNITY): Payer: PPO | Admitting: Emergency Medicine

## 2019-08-23 ENCOUNTER — Inpatient Hospital Stay (HOSPITAL_COMMUNITY): Payer: PPO

## 2019-08-23 ENCOUNTER — Encounter (HOSPITAL_COMMUNITY): Payer: Self-pay | Admitting: Neurosurgery

## 2019-08-23 ENCOUNTER — Encounter (HOSPITAL_COMMUNITY)
Admission: RE | Disposition: A | Discharge status: Home / Self Care | Payer: Self-pay | Source: Home / Self Care | Attending: Neurosurgery

## 2019-08-23 ENCOUNTER — Inpatient Hospital Stay (HOSPITAL_COMMUNITY)
Admission: RE | Admit: 2019-08-23 | Discharge: 2019-08-24 | DRG: 460 | Disposition: A | Discharge status: Home / Self Care | Payer: PPO | Attending: Neurosurgery | Admitting: Neurosurgery

## 2019-08-23 DIAGNOSIS — M4316 Spondylolisthesis, lumbar region: Secondary | ICD-10-CM | POA: Diagnosis present

## 2019-08-23 DIAGNOSIS — M48061 Spinal stenosis, lumbar region without neurogenic claudication: Secondary | ICD-10-CM | POA: Diagnosis not present

## 2019-08-23 DIAGNOSIS — I2510 Atherosclerotic heart disease of native coronary artery without angina pectoris: Secondary | ICD-10-CM | POA: Diagnosis present

## 2019-08-23 DIAGNOSIS — M47816 Spondylosis without myelopathy or radiculopathy, lumbar region: Secondary | ICD-10-CM | POA: Diagnosis not present

## 2019-08-23 DIAGNOSIS — Z955 Presence of coronary angioplasty implant and graft: Secondary | ICD-10-CM

## 2019-08-23 DIAGNOSIS — F17210 Nicotine dependence, cigarettes, uncomplicated: Secondary | ICD-10-CM | POA: Diagnosis not present

## 2019-08-23 DIAGNOSIS — Z419 Encounter for procedure for purposes other than remedying health state, unspecified: Secondary | ICD-10-CM

## 2019-08-23 DIAGNOSIS — M5116 Intervertebral disc disorders with radiculopathy, lumbar region: Principal | ICD-10-CM | POA: Diagnosis present

## 2019-08-23 DIAGNOSIS — M5136 Other intervertebral disc degeneration, lumbar region: Secondary | ICD-10-CM | POA: Diagnosis not present

## 2019-08-23 DIAGNOSIS — I252 Old myocardial infarction: Secondary | ICD-10-CM | POA: Diagnosis not present

## 2019-08-23 DIAGNOSIS — M549 Dorsalgia, unspecified: Secondary | ICD-10-CM | POA: Diagnosis present

## 2019-08-23 DIAGNOSIS — I10 Essential (primary) hypertension: Secondary | ICD-10-CM | POA: Diagnosis present

## 2019-08-23 DIAGNOSIS — E785 Hyperlipidemia, unspecified: Secondary | ICD-10-CM | POA: Diagnosis not present

## 2019-08-23 HISTORY — PX: TRANSFORAMINAL LUMBAR INTERBODY FUSION (TLIF) WITH PEDICLE SCREW FIXATION 2 LEVEL: SHX6142

## 2019-08-23 SURGERY — TRANSFORAMINAL LUMBAR INTERBODY FUSION (TLIF) WITH PEDICLE SCREW FIXATION 2 LEVEL
Anesthesia: General | Site: Back | Laterality: Left

## 2019-08-23 MED ORDER — MIDAZOLAM HCL 5 MG/5ML IJ SOLN
INTRAMUSCULAR | Status: DC | PRN
  Administered 2019-08-23: 2 mg via INTRAVENOUS

## 2019-08-23 MED ORDER — FENTANYL CITRATE (PF) 100 MCG/2ML IJ SOLN: 25.0000 ug | INTRAMUSCULAR | Status: DC | PRN

## 2019-08-23 MED ORDER — PROPOFOL 10 MG/ML IV BOLUS
INTRAVENOUS | Status: AC
  Filled 2019-08-23: qty 40

## 2019-08-23 MED ORDER — SODIUM CHLORIDE 0.9% FLUSH: 3.0000 mL | INTRAVENOUS | Status: DC | PRN

## 2019-08-23 MED ORDER — ROCURONIUM BROMIDE 10 MG/ML (PF) SYRINGE
PREFILLED_SYRINGE | INTRAVENOUS | Status: AC
  Filled 2019-08-23: qty 10

## 2019-08-23 MED ORDER — ACETAMINOPHEN 500 MG PO TABS
1000.0000 mg | ORAL_TABLET | Freq: Once | ORAL | Status: AC
  Administered 2019-08-23: 1000 mg via ORAL
  Filled 2019-08-23: qty 2

## 2019-08-23 MED ORDER — METHOCARBAMOL 500 MG PO TABS
500.0000 mg | ORAL_TABLET | Freq: Four times a day (QID) | ORAL | Status: DC | PRN
  Administered 2019-08-23 – 2019-08-24 (×2): 500 mg via ORAL
  Filled 2019-08-23 (×2): qty 1

## 2019-08-23 MED ORDER — SODIUM CHLORIDE 0.9 % IV SOLN
INTRAVENOUS | Status: DC | PRN
  Administered 2019-08-23: 10 ug/min via INTRAVENOUS

## 2019-08-23 MED ORDER — PHENYLEPHRINE 40 MCG/ML (10ML) SYRINGE FOR IV PUSH (FOR BLOOD PRESSURE SUPPORT)
PREFILLED_SYRINGE | INTRAVENOUS | Status: AC
  Filled 2019-08-23: qty 10

## 2019-08-23 MED ORDER — VASOPRESSIN 20 UNIT/ML IV SOLN
INTRAVENOUS | Status: AC
  Filled 2019-08-23: qty 1

## 2019-08-23 MED ORDER — METOPROLOL TARTRATE 12.5 MG HALF TABLET
12.5000 mg | ORAL_TABLET | Freq: Two times a day (BID) | ORAL | Status: DC
  Administered 2019-08-23: 12.5 mg via ORAL
  Filled 2019-08-23: qty 1

## 2019-08-23 MED ORDER — ONDANSETRON HCL 4 MG/2ML IJ SOLN
INTRAMUSCULAR | Status: DC | PRN
  Administered 2019-08-23: 4 mg via INTRAVENOUS

## 2019-08-23 MED ORDER — ACETAMINOPHEN 325 MG PO TABS: 650.0000 mg | ORAL_TABLET | ORAL | Status: DC | PRN

## 2019-08-23 MED ORDER — OXYCODONE HCL 5 MG PO TABS: 5.0000 mg | ORAL_TABLET | ORAL | Status: DC | PRN

## 2019-08-23 MED ORDER — 0.9 % SODIUM CHLORIDE (POUR BTL) OPTIME
TOPICAL | Status: DC | PRN
  Administered 2019-08-23: 1000 mL

## 2019-08-23 MED ORDER — BUPIVACAINE HCL (PF) 0.5 % IJ SOLN
INTRAMUSCULAR | Status: AC
  Filled 2019-08-23: qty 30

## 2019-08-23 MED ORDER — ZOLPIDEM TARTRATE 5 MG PO TABS: 5.0000 mg | ORAL_TABLET | Freq: Every evening | ORAL | Status: DC | PRN

## 2019-08-23 MED ORDER — GLYCOPYRROLATE PF 0.2 MG/ML IJ SOSY
PREFILLED_SYRINGE | INTRAMUSCULAR | Status: DC | PRN
Start: 2019-08-23 — End: 2019-08-23
  Administered 2019-08-23 (×2): .1 mg via INTRAVENOUS

## 2019-08-23 MED ORDER — THROMBIN 5000 UNITS EX SOLR
OROMUCOSAL | Status: DC | PRN
  Administered 2019-08-23: 5 mL via TOPICAL

## 2019-08-23 MED ORDER — CHLORHEXIDINE GLUCONATE CLOTH 2 % EX PADS: 6.0000 | MEDICATED_PAD | Freq: Once | CUTANEOUS | Status: DC

## 2019-08-23 MED ORDER — LACTATED RINGERS IV SOLN
INTRAVENOUS | Status: DC | PRN
Start: 2019-08-23 — End: 2019-08-23

## 2019-08-23 MED ORDER — LIDOCAINE-EPINEPHRINE 1 %-1:100000 IJ SOLN
INTRAMUSCULAR | Status: AC
  Filled 2019-08-23: qty 1

## 2019-08-23 MED ORDER — LIDOCAINE 2% (20 MG/ML) 5 ML SYRINGE
INTRAMUSCULAR | Status: AC
  Filled 2019-08-23: qty 5

## 2019-08-23 MED ORDER — KCL IN DEXTROSE-NACL 20-5-0.45 MEQ/L-%-% IV SOLN: INTRAVENOUS | Status: DC

## 2019-08-23 MED ORDER — PANTOPRAZOLE SODIUM 40 MG IV SOLR: 40.0000 mg | Freq: Every day | INTRAVENOUS | Status: DC

## 2019-08-23 MED ORDER — GABAPENTIN 400 MG PO CAPS
400.0000 mg | ORAL_CAPSULE | Freq: Four times a day (QID) | ORAL | Status: DC
  Administered 2019-08-23 – 2019-08-24 (×4): 400 mg via ORAL
  Filled 2019-08-23 (×4): qty 1

## 2019-08-23 MED ORDER — NITROGLYCERIN 0.4 MG SL SUBL: 0.4000 mg | SUBLINGUAL_TABLET | SUBLINGUAL | Status: DC | PRN

## 2019-08-23 MED ORDER — ADULT MULTIVITAMIN W/MINERALS CH
1.0000 | ORAL_TABLET | Freq: Every day | ORAL | Status: DC
  Administered 2019-08-23: 1 via ORAL
  Filled 2019-08-23: qty 1

## 2019-08-23 MED ORDER — POLYETHYLENE GLYCOL 3350 17 G PO PACK: 17.0000 g | PACK | Freq: Every day | ORAL | Status: DC | PRN

## 2019-08-23 MED ORDER — DONEPEZIL HCL 10 MG PO TABS
10.0000 mg | ORAL_TABLET | Freq: Every day | ORAL | Status: DC
  Administered 2019-08-23: 10 mg via ORAL
  Filled 2019-08-23 (×2): qty 1

## 2019-08-23 MED ORDER — CEFAZOLIN SODIUM-DEXTROSE 2-4 GM/100ML-% IV SOLN
2.0000 g | Freq: Three times a day (TID) | INTRAVENOUS | Status: AC
  Administered 2019-08-23 (×2): 2 g via INTRAVENOUS
  Filled 2019-08-23 (×2): qty 100

## 2019-08-23 MED ORDER — DEXAMETHASONE SODIUM PHOSPHATE 10 MG/ML IJ SOLN
INTRAMUSCULAR | Status: DC | PRN
  Administered 2019-08-23: 5 mg via INTRAVENOUS

## 2019-08-23 MED ORDER — PANTOPRAZOLE SODIUM 40 MG PO TBEC
40.0000 mg | DELAYED_RELEASE_TABLET | Freq: Every day | ORAL | Status: DC
  Administered 2019-08-23: 40 mg via ORAL
  Filled 2019-08-23: qty 1

## 2019-08-23 MED ORDER — THROMBIN 5000 UNITS EX SOLR
CUTANEOUS | Status: AC
  Filled 2019-08-23: qty 5000

## 2019-08-23 MED ORDER — BUPIVACAINE LIPOSOME 1.3 % IJ SUSP
INTRAMUSCULAR | Status: DC | PRN
  Administered 2019-08-23: 20 mL

## 2019-08-23 MED ORDER — ORAL CARE MOUTH RINSE: 15.0000 mL | Freq: Once | OROMUCOSAL | Status: AC

## 2019-08-23 MED ORDER — DOCUSATE SODIUM 100 MG PO CAPS
100.0000 mg | ORAL_CAPSULE | Freq: Two times a day (BID) | ORAL | Status: DC
  Administered 2019-08-23 (×2): 100 mg via ORAL
  Filled 2019-08-23 (×2): qty 1

## 2019-08-23 MED ORDER — BUPIVACAINE HCL (PF) 0.5 % IJ SOLN
INTRAMUSCULAR | Status: DC | PRN
  Administered 2019-08-23: 5 mL

## 2019-08-23 MED ORDER — GLYCOPYRROLATE PF 0.2 MG/ML IJ SOSY
PREFILLED_SYRINGE | INTRAMUSCULAR | Status: AC
  Filled 2019-08-23: qty 1

## 2019-08-23 MED ORDER — EPHEDRINE SULFATE 50 MG/ML IJ SOLN
INTRAMUSCULAR | Status: DC | PRN
Start: 2019-08-23 — End: 2019-08-23
  Administered 2019-08-23: 15 mg via INTRAVENOUS
  Administered 2019-08-23 (×2): 10 mg via INTRAVENOUS

## 2019-08-23 MED ORDER — SUGAMMADEX SODIUM 200 MG/2ML IV SOLN
INTRAVENOUS | Status: DC | PRN
  Administered 2019-08-23 (×2): 25 mg via INTRAVENOUS
  Administered 2019-08-23: 150 mg via INTRAVENOUS

## 2019-08-23 MED ORDER — ONDANSETRON HCL 4 MG PO TABS: 4.0000 mg | ORAL_TABLET | Freq: Four times a day (QID) | ORAL | Status: DC | PRN

## 2019-08-23 MED ORDER — ATORVASTATIN CALCIUM 80 MG PO TABS
80.0000 mg | ORAL_TABLET | Freq: Every evening | ORAL | Status: DC
  Administered 2019-08-23: 80 mg via ORAL
  Filled 2019-08-23: qty 1

## 2019-08-23 MED ORDER — ROCURONIUM BROMIDE 10 MG/ML (PF) SYRINGE
PREFILLED_SYRINGE | INTRAVENOUS | Status: DC | PRN
  Administered 2019-08-23: 30 mg via INTRAVENOUS
  Administered 2019-08-23: 20 mg via INTRAVENOUS
  Administered 2019-08-23: 80 mg via INTRAVENOUS

## 2019-08-23 MED ORDER — PROPOFOL 10 MG/ML IV BOLUS
INTRAVENOUS | Status: DC | PRN
  Administered 2019-08-23: 20 mg via INTRAVENOUS
  Administered 2019-08-23: 150 mg via INTRAVENOUS
  Administered 2019-08-23: 30 mg via INTRAVENOUS

## 2019-08-23 MED ORDER — THROMBIN 20000 UNITS EX SOLR
CUTANEOUS | Status: AC
  Filled 2019-08-23: qty 20000

## 2019-08-23 MED ORDER — MIDAZOLAM HCL 2 MG/2ML IJ SOLN
INTRAMUSCULAR | Status: AC
  Filled 2019-08-23: qty 2

## 2019-08-23 MED ORDER — FLEET ENEMA 7-19 GM/118ML RE ENEM: 1.0000 | ENEMA | Freq: Once | RECTAL | Status: DC | PRN

## 2019-08-23 MED ORDER — ONDANSETRON HCL 4 MG/2ML IJ SOLN
INTRAMUSCULAR | Status: AC
  Filled 2019-08-23: qty 2

## 2019-08-23 MED ORDER — BUPIVACAINE LIPOSOME 1.3 % IJ SUSP
20.0000 mL | Freq: Once | INTRAMUSCULAR | Status: DC
  Filled 2019-08-23: qty 20

## 2019-08-23 MED ORDER — DEXAMETHASONE SODIUM PHOSPHATE 10 MG/ML IJ SOLN
INTRAMUSCULAR | Status: AC
  Filled 2019-08-23: qty 1

## 2019-08-23 MED ORDER — ALUM & MAG HYDROXIDE-SIMETH 200-200-20 MG/5ML PO SUSP: 30.0000 mL | Freq: Four times a day (QID) | ORAL | Status: DC | PRN

## 2019-08-23 MED ORDER — FENTANYL CITRATE (PF) 250 MCG/5ML IJ SOLN
INTRAMUSCULAR | Status: DC | PRN
  Administered 2019-08-23 (×2): 50 ug via INTRAVENOUS
  Administered 2019-08-23: 100 ug via INTRAVENOUS
  Administered 2019-08-23: 50 ug via INTRAVENOUS

## 2019-08-23 MED ORDER — PHENOL 1.4 % MT LIQD: 1.0000 | OROMUCOSAL | Status: DC | PRN

## 2019-08-23 MED ORDER — METHOCARBAMOL 1000 MG/10ML IJ SOLN
500.0000 mg | Freq: Four times a day (QID) | INTRAVENOUS | Status: DC | PRN
  Filled 2019-08-23: qty 5

## 2019-08-23 MED ORDER — SODIUM CHLORIDE 0.9% FLUSH
3.0000 mL | Freq: Two times a day (BID) | INTRAVENOUS | Status: DC
  Administered 2019-08-23: 3 mL via INTRAVENOUS

## 2019-08-23 MED ORDER — HYDROMORPHONE HCL 1 MG/ML IJ SOLN: 0.5000 mg | INTRAMUSCULAR | Status: DC | PRN

## 2019-08-23 MED ORDER — SODIUM CHLORIDE 0.9 % IV SOLN: 250.0000 mL | INTRAVENOUS | Status: DC

## 2019-08-23 MED ORDER — ASPIRIN EC 81 MG PO TBEC
81.0000 mg | DELAYED_RELEASE_TABLET | Freq: Every day | ORAL | Status: DC
  Administered 2019-08-23: 81 mg via ORAL
  Filled 2019-08-23: qty 1

## 2019-08-23 MED ORDER — VARENICLINE TARTRATE 0.5 MG PO TABS
0.5000 mg | ORAL_TABLET | Freq: Two times a day (BID) | ORAL | Status: DC
  Administered 2019-08-23: 0.5 mg via ORAL
  Filled 2019-08-23 (×3): qty 1

## 2019-08-23 MED ORDER — BISACODYL 10 MG RE SUPP: 10.0000 mg | Freq: Every day | RECTAL | Status: DC | PRN

## 2019-08-23 MED ORDER — ACETAMINOPHEN 650 MG RE SUPP: 650.0000 mg | RECTAL | Status: DC | PRN

## 2019-08-23 MED ORDER — THROMBIN 20000 UNITS EX SOLR
CUTANEOUS | Status: DC | PRN
  Administered 2019-08-23: 20 mL via TOPICAL

## 2019-08-23 MED ORDER — CEFAZOLIN SODIUM-DEXTROSE 2-4 GM/100ML-% IV SOLN
2.0000 g | INTRAVENOUS | Status: AC
  Administered 2019-08-23: 2 g via INTRAVENOUS
  Filled 2019-08-23: qty 100

## 2019-08-23 MED ORDER — EPHEDRINE 5 MG/ML INJ
INTRAVENOUS | Status: AC
  Filled 2019-08-23: qty 10

## 2019-08-23 MED ORDER — ONDANSETRON HCL 4 MG/2ML IJ SOLN: 4.0000 mg | Freq: Four times a day (QID) | INTRAMUSCULAR | Status: DC | PRN

## 2019-08-23 MED ORDER — MENTHOL 3 MG MT LOZG: 1.0000 | LOZENGE | OROMUCOSAL | Status: DC | PRN

## 2019-08-23 MED ORDER — CHLORHEXIDINE GLUCONATE 0.12 % MT SOLN
15.0000 mL | Freq: Once | OROMUCOSAL | Status: AC
  Administered 2019-08-23: 15 mL via OROMUCOSAL
  Filled 2019-08-23: qty 15

## 2019-08-23 MED ORDER — PROMETHAZINE HCL 25 MG/ML IJ SOLN: 6.2500 mg | INTRAMUSCULAR | Status: DC | PRN

## 2019-08-23 MED ORDER — CELECOXIB 200 MG PO CAPS
200.0000 mg | ORAL_CAPSULE | Freq: Once | ORAL | Status: AC
  Administered 2019-08-23: 200 mg via ORAL
  Filled 2019-08-23: qty 1

## 2019-08-23 MED ORDER — PHENYLEPHRINE HCL (PRESSORS) 10 MG/ML IV SOLN
INTRAVENOUS | Status: DC | PRN
Start: 2019-08-23 — End: 2019-08-23
  Administered 2019-08-23: 80 ug via INTRAVENOUS

## 2019-08-23 MED ORDER — FENTANYL CITRATE (PF) 250 MCG/5ML IJ SOLN
INTRAMUSCULAR | Status: AC
  Filled 2019-08-23: qty 5

## 2019-08-23 MED ORDER — LISINOPRIL 20 MG PO TABS
20.0000 mg | ORAL_TABLET | Freq: Every day | ORAL | Status: DC
  Administered 2019-08-23: 20 mg via ORAL
  Filled 2019-08-23: qty 1

## 2019-08-23 MED ORDER — LIDOCAINE 2% (20 MG/ML) 5 ML SYRINGE
INTRAMUSCULAR | Status: DC | PRN
  Administered 2019-08-23: 100 mg via INTRAVENOUS

## 2019-08-23 MED ORDER — LIDOCAINE-EPINEPHRINE 1 %-1:100000 IJ SOLN
INTRAMUSCULAR | Status: DC | PRN
  Administered 2019-08-23: 5 mL

## 2019-08-23 MED ORDER — HYDROCODONE-ACETAMINOPHEN 5-325 MG PO TABS
1.0000 | ORAL_TABLET | ORAL | Status: DC | PRN
  Administered 2019-08-23 (×3): 2 via ORAL
  Administered 2019-08-24 (×2): 1 via ORAL
  Filled 2019-08-23 (×3): qty 2
  Filled 2019-08-23 (×2): qty 1

## 2019-08-23 SURGICAL SUPPLY — 78 items
BASKET BONE COLLECTION (BASKET) ×2 IMPLANT
BLADE CLIPPER SURG (BLADE) ×2 IMPLANT
BONE CANC CHIPS 20CC PCAN1/4 (Bone Implant) ×2 IMPLANT
BUR MATCHSTICK NEURO 3.0 LAGG (BURR) ×2 IMPLANT
BUR PRECISION FLUTE 5.0 (BURR) ×2 IMPLANT
CANISTER SUCT 3000ML PPV (MISCELLANEOUS) ×2 IMPLANT
CARTRIDGE OIL MAESTRO DRILL (MISCELLANEOUS) ×1 IMPLANT
CHIPS CANC BONE 20CC PCAN1/4 (Bone Implant) ×1 IMPLANT
CNTNR URN SCR LID CUP LEK RST (MISCELLANEOUS) ×1 IMPLANT
CONT SPEC 4OZ STRL OR WHT (MISCELLANEOUS) ×2
COVER BACK TABLE 24X17X13 BIG (DRAPES) IMPLANT
COVER BACK TABLE 60X90IN (DRAPES) ×2 IMPLANT
COVER WAND RF STERILE (DRAPES) ×2 IMPLANT
DECANTER SPIKE VIAL GLASS SM (MISCELLANEOUS) ×2 IMPLANT
DERMABOND ADVANCED (GAUZE/BANDAGES/DRESSINGS) ×1
DERMABOND ADVANCED .7 DNX12 (GAUZE/BANDAGES/DRESSINGS) ×1 IMPLANT
DIFFUSER DRILL AIR PNEUMATIC (MISCELLANEOUS) ×2 IMPLANT
DRAPE C-ARM 42X72 X-RAY (DRAPES) ×2 IMPLANT
DRAPE C-ARMOR (DRAPES) ×4 IMPLANT
DRAPE LAPAROTOMY 100X72X124 (DRAPES) ×2 IMPLANT
DRAPE SURG 17X23 STRL (DRAPES) ×2 IMPLANT
DRSG OPSITE POSTOP 4X6 (GAUZE/BANDAGES/DRESSINGS) ×2 IMPLANT
DURAPREP 26ML APPLICATOR (WOUND CARE) ×2 IMPLANT
ELECT BLADE 4.0 EZ CLEAN MEGAD (MISCELLANEOUS) ×2
ELECT REM PT RETURN 9FT ADLT (ELECTROSURGICAL) ×2
ELECTRODE BLDE 4.0 EZ CLN MEGD (MISCELLANEOUS) ×1 IMPLANT
ELECTRODE REM PT RTRN 9FT ADLT (ELECTROSURGICAL) ×1 IMPLANT
GAUZE 4X4 16PLY RFD (DISPOSABLE) IMPLANT
GAUZE SPONGE 4X4 12PLY STRL (GAUZE/BANDAGES/DRESSINGS) ×2 IMPLANT
GLOVE BIO SURGEON STRL SZ8 (GLOVE) ×4 IMPLANT
GLOVE BIOGEL PI IND STRL 7.5 (GLOVE) ×2 IMPLANT
GLOVE BIOGEL PI IND STRL 8 (GLOVE) ×4 IMPLANT
GLOVE BIOGEL PI IND STRL 8.5 (GLOVE) ×2 IMPLANT
GLOVE BIOGEL PI INDICATOR 7.5 (GLOVE) ×2
GLOVE BIOGEL PI INDICATOR 8 (GLOVE) ×4
GLOVE BIOGEL PI INDICATOR 8.5 (GLOVE) ×2
GLOVE ECLIPSE 7.0 STRL STRAW (GLOVE) ×2 IMPLANT
GLOVE ECLIPSE 8.0 STRL XLNG CF (GLOVE) ×4 IMPLANT
GLOVE EXAM NITRILE XL STR (GLOVE) IMPLANT
GLOVE SS N UNI LF 7.0 STRL (GLOVE) ×6 IMPLANT
GOWN STRL REUS W/ TWL LRG LVL3 (GOWN DISPOSABLE) ×1 IMPLANT
GOWN STRL REUS W/ TWL XL LVL3 (GOWN DISPOSABLE) ×4 IMPLANT
GOWN STRL REUS W/TWL 2XL LVL3 (GOWN DISPOSABLE) ×2 IMPLANT
GOWN STRL REUS W/TWL LRG LVL3 (GOWN DISPOSABLE) ×1
GOWN STRL REUS W/TWL XL LVL3 (GOWN DISPOSABLE) ×8
HEMOSTAT POWDER SURGIFOAM 1G (HEMOSTASIS) ×2 IMPLANT
IMPL TLX20 9X11X31 20D (Cage) ×2 IMPLANT
KIT BASIN OR (CUSTOM PROCEDURE TRAY) ×2 IMPLANT
KIT INFUSE SMALL (Orthopedic Implant) ×2 IMPLANT
KIT POSITION SURG JACKSON T1 (MISCELLANEOUS) ×2 IMPLANT
KIT TURNOVER KIT B (KITS) ×2 IMPLANT
MILL MEDIUM DISP (BLADE) ×2 IMPLANT
NEEDLE HYPO 25X1 1.5 SAFETY (NEEDLE) ×2 IMPLANT
NEEDLE SPNL 18GX3.5 QUINCKE PK (NEEDLE) ×2 IMPLANT
NS IRRIG 1000ML POUR BTL (IV SOLUTION) ×2 IMPLANT
OIL CARTRIDGE MAESTRO DRILL (MISCELLANEOUS) ×2
PACK LAMINECTOMY NEURO (CUSTOM PROCEDURE TRAY) ×2 IMPLANT
PAD ARMBOARD 7.5X6 YLW CONV (MISCELLANEOUS) IMPLANT
PATTIES SURGICAL .5 X.5 (GAUZE/BANDAGES/DRESSINGS) IMPLANT
PATTIES SURGICAL .5 X1 (DISPOSABLE) IMPLANT
PATTIES SURGICAL 1X1 (DISPOSABLE) IMPLANT
ROD RELIN-O LORD 5.5X65MM (Rod) ×4 IMPLANT
SCREW LOCK RELINE 5.5 TULIP (Screw) ×12 IMPLANT
SCREW RELINE-O POLY 6.5X45 (Screw) ×12 IMPLANT
SPONGE LAP 4X18 RFD (DISPOSABLE) IMPLANT
SPONGE SURGIFOAM ABS GEL 100 (HEMOSTASIS) ×2 IMPLANT
STAPLER SKIN PROX WIDE 3.9 (STAPLE) IMPLANT
SUT VIC AB 1 CT1 18XBRD ANBCTR (SUTURE) ×2 IMPLANT
SUT VIC AB 1 CT1 8-18 (SUTURE) ×2
SUT VIC AB 2-0 CT1 18 (SUTURE) ×4 IMPLANT
SUT VIC AB 3-0 SH 8-18 (SUTURE) ×4 IMPLANT
SYR 5ML LL (SYRINGE) IMPLANT
TLX20 IMPLANT 9X11X31 20D (Cage) ×4 IMPLANT
TOWEL GREEN STERILE (TOWEL DISPOSABLE) ×2 IMPLANT
TOWEL GREEN STERILE FF (TOWEL DISPOSABLE) ×2 IMPLANT
TRAY CATH 16FR W/PLASTIC CATH (SET/KITS/TRAYS/PACK) ×2 IMPLANT
TRAY FOLEY MTR SLVR 16FR STAT (SET/KITS/TRAYS/PACK) IMPLANT
WATER STERILE IRR 1000ML POUR (IV SOLUTION) ×2 IMPLANT

## 2019-08-23 NOTE — Interval H&P Note (Signed)
History and Physical Interval Note:  08/23/2019 7:20 AM  Mark Williams  has presented today for surgery, with the diagnosis of Lumbar foraminal stenosis.  The various methods of treatment have been discussed with the patient and family. After consideration of risks, benefits and other options for treatment, the patient has consented to  Procedure(s) with comments: Left Lumbar 4-5 Lumbar 5-Sacral 1 Transforaminal lumbar interbody fusion (Left) - 3c as a surgical intervention.  The patient's history has been reviewed, patient examined, no change in status, stable for surgery.  I have reviewed the patient's chart and labs.  Questions were answered to the patient's satisfaction.     Dorian Heckle

## 2019-08-23 NOTE — Op Note (Signed)
08/23/2019  11:28 AM  PATIENT:  Mark Williams  69 y.o. male  PRE-OPERATIVE DIAGNOSIS:  Lumbar foraminal stenosis, lumbar spondylolisthesis, herniated lumbar disc, lumbar radiculopathy, lumbago L 45 and L 5 S 1 levels  POST-OPERATIVE DIAGNOSIS: Lumbar foraminal stenosis, lumbar spondylolisthesis, herniated lumbar disc, lumbar radiculopathy, lumbago L 45 and L 5 S 1 levels   PROCEDURE:  Procedure(s) with comments: Left Lumbar 4-5 Lumbar 5-Sacral 1 Transforaminal lumbar interbody fusion (Left) - 3c with expandable titanium cages, autograft, pedicle screw fixation, posterolateral arthrodesis  SURGEON:  Surgeon(s) and Role:    * Shawnay Bramel, MD - Primary    * Nundkumar, Neelesh, MD - Assisting  PHYSICIAN ASSISTANT:   ASSISTANTS: Poteat, RN   McDaniel, NP   ANESTHESIA:   general  EBL:  250 mL   BLOOD ADMINISTERED:none  DRAINS: none   LOCAL MEDICATIONS USED:  MARCAINE    and LIDOCAINE   SPECIMEN:  No Specimen  DISPOSITION OF SPECIMEN:  N/A  COUNTS:  YES  TOURNIQUET:  * No tourniquets in log *  DICTATION: Patient is 69-year-old man with  HNP, spondylolisthesis,  stenosis, DDD, radiculopathy L4/5, L5/S1. He has severe left leg pain and weakness. It was elected to take him to surgery for decompression and fusion at L4/5 and L5/S1 levels via left TLIF approach.    Procedure: Patient was placed in a prone position on the Jackson table after smooth and uncomplicated induction of general endotracheal anesthesia. His low back was prepped and draped in usual sterile fashion with betadine scrub and DuraPrep. Area of incision was infiltrated with local lidocaine. Incision was made to the lumbodorsal fascia was incised and exposure was performed of the L4/5, L5/S1 spinous processes laminae facet joint and transverse processes. Intraoperative x-ray was obtained which confirmed correct orientation. A total hemi-laminectomy of L4 and L5 was performed on the left with disarticulation of the facet  joints at this level and thorough decompression was performed of both L4, L5 and S1 nerve roots along with the common dural tube. There was densely adherent spondylytic material compressing the thecal sac and both L4 and L5 nerve roots.  Decompression was greater than would be typically performed for simple interbody fusion. There was a disc herniation at the L 45 level on the left.  A thorough discectomy and preparation of the endplates was performed at both the L4/5 and L5/S1 levels.  The interspaces were packed with small BMP,titanium expandable TLIF cages  (9mm x 11 x 31 mm x 20 degree at L5/S1 and the same size at L4/5) . Bone autograft was packed within the interspace: 6 cc of autograft was placed in the L5/S1 interspace and a similar amount at L 45 prior to placing the expandable cages. The posterolateral region was extensively decorticated and pedicle probes were placed at L4, L5 and S1 bilaterally. Intraoperative fluoroscopy confirmed correct orientationin the AP and lateral plane. 45 x 6.5 mm pedicle screws were placed at S1 bilaterally and 45 x 6.5 mm screws placed at L5 bilaterally and 45 x 6.5mm screws were placed at L4 bilaterally.  Final x-rays demonstrated well-positioned interbody grafts and pedicle screw fixation. 65 mm lordotic rods were placed and locked down in situ and the posterolateral region was packed with the remaining bone graft  on the right (Remaining BMP, 10 cc autograft, 20 cc allograft).   Fascia was closed with 1 Vicryl sutures skin edges were reapproximated 2 and 3-0 Vicryl sutures. Long-acting Marcaine was injected in the deep musculature. The wound is   dressed with Dermabond and an occlusive dressing. The patient was extubated in the operating room and taken to recovery in stable satisfactory condition having tolerated the operation well. Counts were correct at the end of the case.    PLAN OF CARE: Admit to inpatient   PATIENT DISPOSITION:  PACU - hemodynamically stable.    Delay start of Pharmacological VTE agent (>24hrs) due to surgical blood loss or risk of bleeding: yes

## 2019-08-23 NOTE — Brief Op Note (Signed)
08/23/2019  11:28 AM  PATIENT:  Mark Williams  69 y.o. male  PRE-OPERATIVE DIAGNOSIS:  Lumbar foraminal stenosis, lumbar spondylolisthesis, herniated lumbar disc, lumbar radiculopathy, lumbago L 45 and L 5 S 1 levels  POST-OPERATIVE DIAGNOSIS: Lumbar foraminal stenosis, lumbar spondylolisthesis, herniated lumbar disc, lumbar radiculopathy, lumbago L 45 and L 5 S 1 levels   PROCEDURE:  Procedure(s) with comments: Left Lumbar 4-5 Lumbar 5-Sacral 1 Transforaminal lumbar interbody fusion (Left) - 3c with expandable titanium cages, autograft, pedicle screw fixation, posterolateral arthrodesis  SURGEON:  Surgeon(s) and Role:    Erline Levine, MD - Primary    * Consuella Lose, MD - Assisting  PHYSICIAN ASSISTANT:   ASSISTANTS: Poteat, RN   Glenford Peers, NP   ANESTHESIA:   general  EBL:  250 mL   BLOOD ADMINISTERED:none  DRAINS: none   LOCAL MEDICATIONS USED:  MARCAINE    and LIDOCAINE   SPECIMEN:  No Specimen  DISPOSITION OF SPECIMEN:  N/A  COUNTS:  YES  TOURNIQUET:  * No tourniquets in log *  DICTATION: Patient is 69 year old man with  HNP, spondylolisthesis,  stenosis, DDD, radiculopathy L4/5, L5/S1. He has severe left leg pain and weakness. It was elected to take him to surgery for decompression and fusion at L4/5 and L5/S1 levels via left TLIF approach.    Procedure: Patient was placed in a prone position on the [place] table after smooth and uncomplicated induction of general endotracheal anesthesia. His low back was prepped and draped in usual sterile fashion with betadine scrub and DuraPrep. Area of incision was infiltrated with local lidocaine. Incision was made to the lumbodorsal fascia was incised and exposure was performed of the L4/5, L5/S1 spinous processes laminae facet joint and transverse processes. Intraoperative x-ray was obtained which confirmed correct orientation. A total hemi-laminectomy of L4 and L5 was performed on the left with disarticulation of the facet  joints at this level and thorough decompression was performed of both L4, L5 and S1 nerve roots along with the common dural tube. There was densely adherent spondylytic material compressing the thecal sac and both L4 and L5 nerve roots.  Decompression was greater than would be typically performed for simple interbody fusion. There was a disc herniation at the L 45 level on the left.  A thorough discectomy and preparation of the endplates was performed at both the L4/5 and L5/S1 levels.  The interspaces were packed with small BMP,titanium expandable TLIF cages  (24mm x 11 x 31 mm x 20 degree at L5/S1 and the same size at L4/5) . Bone autograft was packed within the interspace: 6 cc of autograft was placed in the L5/S1 interspace and a similar amount at L 45 prior to placing the expandable cages. The posterolateral region was extensively decorticated and pedicle probes were placed at L4, L5 and S1 bilaterally. Intraoperative fluoroscopy confirmed correct orientationin the AP and lateral plane. 45 x 6.5 mm pedicle screws were placed at S1 bilaterally and 45 x 6.5 mm screws placed at L5 bilaterally and 45 x 6.80mm screws were placed at L4 bilaterally.  Final x-rays demonstrated well-positioned interbody grafts and pedicle screw fixation. 65 mm lordotic rods were placed and locked down in situ and the posterolateral region was packed with the remaining bone graft  on the right (Remaining BMP, 10 cc autograft, 20 cc allograft).   Fascia was closed with 1 Vicryl sutures skin edges were reapproximated 2 and 3-0 Vicryl sutures. Long-acting Marcaine was injected in the deep musculature. The wound is  dressed with Dermabond and an occlusive dressing. The patient was extubated in the operating room and taken to recovery in stable satisfactory condition having tolerated the operation well. Counts were correct at the end of the case.    PLAN OF CARE: Admit to inpatient   PATIENT DISPOSITION:  PACU - hemodynamically stable.    Delay start of Pharmacological VTE agent (>24hrs) due to surgical blood loss or risk of bleeding: yes

## 2019-08-23 NOTE — Transfer of Care (Signed)
Immediate Anesthesia Transfer of Care Note  Patient: Mark Williams  Procedure(s) Performed: Left Lumbar 4-5 Lumbar 5-Sacral 1 Transforaminal lumbar interbody fusion (Left Back)  Patient Location: PACU  Anesthesia Type:General  Level of Consciousness: awake, alert  and oriented  Airway & Oxygen Therapy: Patient Spontanous Breathing and Patient connected to face mask oxygen  Post-op Assessment: Report given to RN, Post -op Vital signs reviewed and stable and Patient moving all extremities X 4  Post vital signs: Reviewed and stable  Last Vitals:  Vitals Value Taken Time  BP 130/74 08/23/19 1130  Temp    Pulse 79 08/23/19 1130  Resp 20 08/23/19 1130  SpO2 100 % 08/23/19 1130  Vitals shown include unvalidated device data.  Last Pain:  Vitals:   08/23/19 0642  PainSc: 0-No pain      Patients Stated Pain Goal: 3 (08/23/19 6256)  Complications: No complications documented.

## 2019-08-23 NOTE — Anesthesia Postprocedure Evaluation (Signed)
Anesthesia Post Note  Patient: Mark Williams  Procedure(s) Performed: Left Lumbar 4-5 Lumbar 5-Sacral 1 Transforaminal lumbar interbody fusion (Left Back)     Patient location during evaluation: PACU Anesthesia Type: General Level of consciousness: sedated Pain management: pain level controlled Vital Signs Assessment: post-procedure vital signs reviewed and stable Respiratory status: spontaneous breathing and respiratory function stable Cardiovascular status: stable Postop Assessment: no apparent nausea or vomiting Anesthetic complications: no   No complications documented.  Last Vitals:  Vitals:   08/23/19 1159 08/23/19 1213  BP:  133/74  Pulse:  67  Resp:  16  Temp: (!) 36.3 C (!) 36.4 C  SpO2:  97%                  Katena Petitjean DANIEL

## 2019-08-23 NOTE — Anesthesia Procedure Notes (Signed)
Procedure Name: Intubation Date/Time: 08/23/2019 7:40 AM Performed by: Rachel Moulds, CRNA Pre-anesthesia Checklist: Patient identified, Emergency Drugs available, Suction available and Patient being monitored Patient Re-evaluated:Patient Re-evaluated prior to induction Oxygen Delivery Method: Circle System Utilized Preoxygenation: Pre-oxygenation with 100% oxygen Induction Type: IV induction Ventilation: Mask ventilation without difficulty and Oral airway inserted - appropriate to patient size Laryngoscope Size: Hyacinth Meeker and 3 Grade View: Grade I Tube type: Oral Tube size: 7.5 mm Number of attempts: 1 Airway Equipment and Method: Stylet and Oral airway Placement Confirmation: ETT inserted through vocal cords under direct vision,  positive ETCO2 and breath sounds checked- equal and bilateral Secured at: 22 cm Tube secured with: Tape Dental Injury: Teeth and Oropharynx as per pre-operative assessment

## 2019-08-24 ENCOUNTER — Encounter (HOSPITAL_COMMUNITY): Payer: Self-pay | Admitting: Neurosurgery

## 2019-08-24 MED ORDER — HYDROCODONE-ACETAMINOPHEN 5-325 MG PO TABS: 1.0000 | ORAL_TABLET | ORAL | 0 refills | Status: AC | PRN

## 2019-08-24 MED ORDER — METHOCARBAMOL 500 MG PO TABS: 500.0000 mg | ORAL_TABLET | Freq: Four times a day (QID) | ORAL | 1 refills | Status: AC | PRN

## 2019-08-24 MED FILL — Sodium Chloride IV Soln 0.9%: INTRAVENOUS | Qty: 1000 | Status: AC

## 2019-08-24 MED FILL — Heparin Sodium (Porcine) Inj 1000 Unit/ML: INTRAMUSCULAR | Qty: 30 | Status: AC

## 2019-08-24 NOTE — Discharge Summary (Signed)
Physician Discharge Summary  Patient ID: Mark Williams MRN: [id number] DOB/AGE: [birth year] 69 y.o.  Admit date: 08/23/2019 Discharge date: 08/24/2019  Admission Diagnoses: Lumbar foraminal stenosis, lumbar spondylolisthesis, herniated lumbar disc, lumbar radiculopathy, lumbago L 45 and L 5 S 1 levels    Discharge Diagnoses: Lumbar foraminal stenosis, lumbar spondylolisthesis, herniated lumbar disc, lumbar radiculopathy, lumbago L 45 and L 5 S 1 levels s/p Left Lumbar 4-5 Lumbar 5-Sacral 1 Transforaminal lumbar interbody fusion (Left) - 3c with expandable titanium cages, autograft, pedicle screw fixation, posterolateral arthrodesis     Active Problems:   Spondylolisthesis of lumbar region   Discharged Condition: good  Hospital Course: Mark Williams was admitted with dx lumbar stenosis, spondylolisthesis, and radiculopathy. Following uncomplicated surgery (above) he recovered nicely and transferred to [facility] for nursing care and therapies. He is mobilizing well with excellent pain relief.  Consults: None  Significant Diagnostic Studies: radiology: X-Ray: intra-op  Treatments: surgery: Left Lumbar 4-5 Lumbar 5-Sacral 1 Transforaminal lumbar interbody fusion (Left) - 3c with expandable titanium cages, autograft, pedicle screw fixation, posterolateral arthrodesis    Discharge Exam: Blood pressure 128/73, pulse 66, temperature 97.7 F (36.5 C), temperature source Oral, resp. rate 16, height 5\' 7"  (1.702 m), weight 68.8 kg, SpO2 100 %. Walking in room, wearing LSO. Reports resolution of left leg pain. Incision without erythema, swelling or drainage beneath honeycomb and dermabond. Good strength BLE.    Disposition:  Discharge to home. Norco 5/325 and Robaxin 500mg  will be eRx'ed for prn home use. He and his wife verbalize understanding of d/c instructions. He will call office to schedule 3-4 week f/u visit.He acknowledges the need to stop  smoking.      Discharge Instructions     Remove dressing in 72 hours   Complete by: As directed    Diet - low sodium heart healthy   Complete by: As directed    Increase activity slowly   Complete by: As directed      Allergies as of 08/24/2019   No Known Allergies     Medication List    STOP taking these medications   diclofenac 75 MG EC tablet Commonly known as: VOLTAREN     TAKE these medications   aspirin 81 MG EC tablet Take 1 tablet (81 mg total) by mouth daily.   atorvastatin 80 MG tablet Commonly known as: LIPITOR TAKE 1 TABLET (80 MG TOTAL) BY MOUTH DAILY AT 6 PM. What changed: See the new instructions.   Chantix 0.5 MG tablet Generic drug: varenicline Take 0.5 mg by mouth 2 (two) times daily.   donepezil 10 MG tablet Commonly known as: ARICEPT Take 10 mg by mouth daily.   gabapentin 400 MG capsule Commonly known as: NEURONTIN Take 400 mg by mouth 4 (four) times daily.   HYDROcodone-acetaminophen 5-325 MG tablet Commonly known as: NORCO/VICODIN Take 1-2 tablets by mouth every 4 (four) hours as needed for moderate pain.   lisinopril 20 MG tablet Commonly known as: ZESTRIL Take 20 mg by mouth daily.   methocarbamol 500 MG tablet Commonly known as: ROBAXIN Take 1 tablet (500 mg total) by mouth every 6 (six) hours as needed for muscle spasms.   metoprolol tartrate 25 MG tablet Commonly known as: LOPRESSOR TAKE 0.5 TABLETS (12.5 MG TOTAL) BY MOUTH 2 (TWO) TIMES DAILY. What changed: See the new instructions.   multivitamin with minerals tablet Take 1 tablet by mouth daily.   nitroGLYCERIN 0.4 MG SL tablet Commonly known as: NITROSTAT Place 1 tablet (0.4 mg  total) under the tongue every 5 (five) minutes x 3 doses as needed for chest pain.   Viagra 100 MG tablet Generic drug: sildenafil Take 1 tablet by mouth as needed.        Signed: Dorian Heckle, MD 08/24/2019, 8:22 AM

## 2019-08-24 NOTE — Progress Notes (Signed)
OT Cancellation Note  Patient Details Name: Mark Williams MRN: [id number] DOB: [birth year]   Cancelled Treatment:    Reason Eval/Treat Not Completed: OT screened, no needs identified, will sign off. Upon arrival patient fully dressed and ambulating around his room. Patient reports his spouse has had x5 back surgeries and is familiar with back precautions, donn/doffing brace. No acute OT needs at this time. Please re-consult if new needs arise.   Marlyce Huge OT OT office: [phone]   Carmelia Roller 08/24/2019, 8:19 AM

## 2019-08-24 NOTE — Plan of Care (Signed)
Pt doing well. Pt and wife given D/C instructions with verbal understanding. Rx's were sent to the pharmacy by MD. Pt's incision is clean and dry with no sign of infection. Pt's IV was removed prior to D/C. Pt D/C'd home via wheelchair per MD order. Pt is stable @ D/C and has no other needs at this time. Jazalynn Mireles, RN  

## 2019-08-24 NOTE — Discharge Instructions (Signed)

## 2019-08-24 NOTE — Progress Notes (Addendum)
Subjective: Patient reports "I'm good. That's gone.(leg pain)"  Objective: Vital signs in last 24 hours: Temp:  [97.1 F (36.2 C)-98.2 F (36.8 C)] 97.7 F (36.5 C) (06/23 0733) Pulse Rate:  [59-79] 66 (06/23 0733) Resp:  [10-20] 16 (06/23 0733) BP: (117-134)/(71-86) 128/73 (06/23 0733) SpO2:  [96 %-100 %] 100 % (06/23 0733)  Intake/Output from previous day: 06/22 0701 - 06/23 0700 In: 1320 [P.O.:120; I.V.:1100; IV Piggyback:100] Out: 325 [Urine:75; Blood:250] Intake/Output this shift: No intake/output data recorded.  Walking in room, wearing LSO. Reports resolution of left leg pain. Incision without erythema, swelling or drainage beneath honeycomb and dermabond. Good strength BLE.   Lab Results: No results for input(s): WBC, HGB, HCT, PLT in the last 72 hours. BMET No results for input(s): NA, K, CL, CO2, GLUCOSE, BUN, CREATININE, CALCIUM in the last 72 hours.  Studies/Results: DG Lumbar Spine 2-3 Views  Result Date: 08/23/2019 CLINICAL DATA:  Lumbar surgery. EXAM: LUMBAR SPINE - 2-3 VIEW; DG C-ARM 1-60 MIN COMPARISON:  Prior lumbar spine report 04/14/2019. FINDINGS: Postsurgical changes noted the lumbosacral spine. Hardware intact. Anatomic alignment. Degenerative change lumbar spine. No acute bony abnormality. IMPRESSION: Postsurgical changes lumbosacral spine. Electronically Signed   By: Maisie Fus  Register   On: 08/23/2019 14:19   DG C-Arm 1-60 Min  Result Date: 08/23/2019 CLINICAL DATA:  Lumbar surgery. EXAM: LUMBAR SPINE - 2-3 VIEW; DG C-ARM 1-60 MIN COMPARISON:  Prior lumbar spine report 04/14/2019. FINDINGS: Postsurgical changes noted the lumbosacral spine. Hardware intact. Anatomic alignment. Degenerative change lumbar spine. No acute bony abnormality. IMPRESSION: Postsurgical changes lumbosacral spine. Electronically Signed   By: Maisie Fus  Register   On: 08/23/2019 14:19    Assessment/Plan: improved  LOS: 1 day  Ok to d/c to home per DrStern. Norco 5/325 and Robaxin  500mg  will be eRx'ed for prn home use. He and his wife verbalize understanding of d/c instructions. He will call office to schedule 3-4 week f/u visit. He acknowledges the need to stop smoking.   08/24/2019, 7:52 AM   Patient is doing well.  Discharge home.

## 2019-08-24 NOTE — Evaluation (Signed)
Physical Therapy Evaluation & Discharge Patient Details Name: SURESH AUDI MRN: [id number] DOB: 1950-11-04 Today's Date: 08/24/2019   History of Present Illness  Pt is a 69 y/o male s/p L L4-S1 TLIF. PMH including but not limited to HTN, MI 2015, hyperlipidemia, CAD.  Clinical Impression  Pt presented standing up in room with wife present, willing to participate in therapy session. Prior to admission, pt reported that he was independent with all functional mobility and ADLs. Pt lives with his spouse, one of their children and a grandchild in a single level home with one small step to enter. At the time of evaluation, pt overall moving very well without the need for physical assistance or use of an AD. Pt independent with all functional mobility including hallway ambulation. No LOB or instability noted. PT provided pt education re: back precautions, car transfers and a generalized walking program for pt to initiate upon d/c home. No further acute PT needs identified at this time. PT signing off.     Follow Up Recommendations No PT follow up    Equipment Recommendations  None recommended by PT    Recommendations for Other Services       Precautions / Restrictions Precautions Precautions: Back Precaution Booklet Issued: Yes (comment) Precaution Comments: pt was familiar with all precautions as he has cared for his wife who has had multiple back surgeries  Required Braces or Orthoses: Spinal Brace Spinal Brace: Lumbar corset;Applied in sitting position Restrictions Weight Bearing Restrictions: No      Mobility  Bed Mobility               General bed mobility comments: pt OOB standing in room with wife present upon arrival; verbally reviewed log roll technique  Transfers Overall transfer level: Independent                  Ambulation/Gait Ambulation/Gait assistance: Independent Gait Distance (Feet): 500 Feet Assistive device: None Gait Pattern/deviations: WFL(Within  Functional Limits) Gait velocity: WFL      Stairs            Wheelchair Mobility    Modified Rankin (Stroke Patients Only)       Balance Overall balance assessment: No apparent balance deficits (not formally assessed)                                           Pertinent Vitals/Pain Pain Assessment: No/denies pain    Home Living Family/patient expects to be discharged to:: Private residence Living Arrangements: Spouse/significant other;Children;Other relatives Available Help at Discharge: Family Type of Home: House Home Access: Stairs to enter Entrance Stairs-Rails: None Entrance Stairs-Number of Steps: 1 (threshold) Home Layout: One level Home Equipment: [place] - 2 wheels;Cane - single point;Bedside commode      Prior Function Level of Independence: Independent               Hand Dominance        Extremity/Trunk Assessment   Upper Extremity Assessment Upper Extremity Assessment: Overall WFL for tasks assessed    Lower Extremity Assessment Lower Extremity Assessment: Overall WFL for tasks assessed    Cervical / Trunk Assessment Cervical / Trunk Assessment: Other exceptions Cervical / Trunk Exceptions: s/p lumbar sx  Communication   Communication: No difficulties  Cognition Arousal/Alertness: Awake/alert Behavior During Therapy: WFL for tasks assessed/performed Overall Cognitive Status: Within Functional Limits for tasks assessed  General Comments      Exercises     Assessment/Plan    PT Assessment Patent does not need any further PT services  PT Problem List         PT Treatment Interventions      PT Goals (Current goals can be found in the Care Plan section)  Acute Rehab PT Goals Patient Stated Goal: "home today" PT Goal Formulation: All assessment and education complete, DC therapy    Frequency     Barriers to discharge        Co-evaluation                AM-PAC PT "6 Clicks" Mobility  Outcome Measure Help needed turning from your back to your side while in a flat bed without using bedrails?: None Help needed moving from lying on your back to sitting on the side of a flat bed without using bedrails?: None Help needed moving to and from a bed to a chair (including a wheelchair)?: None Help needed standing up from a chair using your arms (e.g., wheelchair or bedside chair)?: None Help needed to walk in hospital room?: None Help needed climbing 3-5 steps with a railing? : None 6 Click Score: 24    End of Session Equipment Utilized During Treatment: Back brace Activity Tolerance: Patient tolerated treatment well Patient left: with call bell/phone within reach;with family/visitor present Nurse Communication: Mobility status PT Visit Diagnosis: Other abnormalities of gait and mobility (R26.89)    Time: 4650-3546 PT Time Calculation (min) (ACUTE ONLY): 11 min   Charges:   PT Evaluation $PT Eval Low Complexity: 1 Low          Ginette Pitman, PT, DPT  Acute Rehabilitation Services Pager [phone] Office [phone]    Alessandra Bevels Brynlei Klausner 08/24/2019, 8:31 AM

## 2019-09-14 DIAGNOSIS — M5416 Radiculopathy, lumbar region: Secondary | ICD-10-CM | POA: Diagnosis not present

## 2019-11-02 ENCOUNTER — Other Ambulatory Visit: Payer: Self-pay

## 2019-11-02 ENCOUNTER — Encounter: Payer: Self-pay | Admitting: Cardiovascular Disease

## 2019-11-02 ENCOUNTER — Ambulatory Visit (INDEPENDENT_AMBULATORY_CARE_PROVIDER_SITE_OTHER): Payer: PPO | Admitting: Cardiovascular Disease

## 2019-11-02 VITALS — BP 129/83 | HR 64 | Ht 67.0 in | Wt 147.8 lb

## 2019-11-02 DIAGNOSIS — E7800 Pure hypercholesterolemia, unspecified: Secondary | ICD-10-CM

## 2019-11-02 DIAGNOSIS — I10 Essential (primary) hypertension: Secondary | ICD-10-CM

## 2019-11-02 DIAGNOSIS — I2510 Atherosclerotic heart disease of native coronary artery without angina pectoris: Secondary | ICD-10-CM

## 2019-11-02 LAB — LIPID PANEL
Chol/HDL Ratio: 1.8 ratio (ref 0.0–5.0)
Cholesterol, Total: 96 mg/dL — ABNORMAL LOW (ref 100–199)
HDL: 52 mg/dL (ref >39)
LDL Chol Calc (NIH): 34 mg/dL (ref 0–99)
Triglycerides: 35 mg/dL (ref 0–149)
VLDL Cholesterol Cal: 10 mg/dL (ref 5–40)

## 2019-11-02 NOTE — Patient Instructions (Signed)
Medication Instructions:  No changes *If you need a refill on your cardiac medications before your next appointment, please call your pharmacy*   Lab Work: Your provider would like for you to have the following labs today: Lipid  If you have labs (blood work) drawn today and your tests are completely normal, you will receive your results only by: . MyChart Message (if you have MyChart) OR . A paper copy in the mail If you have any lab test that is abnormal or we need to change your treatment, we will call you to review the results.   Testing/Procedures: None ordered   Follow-Up: At [facility], you and your health needs are our priority.  As part of our continuing mission to provide you with exceptional heart care, we have created designated Provider Care Teams.  These Care Teams include your primary Cardiologist (physician) and Advanced Practice Providers (APPs -  Physician Assistants and Nurse Practitioners) who all work together to provide you with the care you need, when you need it.  We recommend signing up for the patient portal called "MyChart".  Sign up information is provided on this After Visit Summary.  MyChart is used to connect with patients for Virtual Visits (Telemedicine).  Patients are able to view lab/test results, encounter notes, upcoming appointments, etc.  Non-urgent messages can be sent to your provider as well.   To learn more about what you can do with MyChart, go to [email or web address].    Your next appointment:   12 month(s)  The format for your next appointment:   In Person  Provider:   You may see Mihai Croitoru, MD or one of the following Advanced Practice Providers on your designated Care Team:    Hao Meng, PA-C  Angela Duke, PA-C or   Krista Kroeger, PA-C     

## 2019-11-02 NOTE — Progress Notes (Signed)
Cardiology Office Note    Date:  11/03/2019   ID:  Mark Williams, DOB 06/25/1950, MRN [id number]  PCP:  [name], MD  Cardiologist:   [name], MD   Chief Complaint  Patient presents with  . Coronary Artery Disease    History of Present Illness:  Mark Williams is a 69 y.o. male with coronary artery disease presenting with non-ST segment elevation myocardial infarction and placement of drug-eluting stents (Xience 3.0x15 proximal, 2.5x15 distal ) to the right coronary artery in June 2015 (with residual 70% stenosis in a small ramus intermedius artery and scattered minor lesions in the LAD), hypertension, hyperlipidemia and ongoing smoking here for follow-up.   The patient specifically denies any chest pain at rest exertion, dyspnea at rest or with exertion, orthopnea, paroxysmal nocturnal dyspnea, syncope, palpitations, focal neurological deficits, intermittent claudication, lower extremity edema, unexplained weight gain, cough, hemoptysis or wheezing. He quit smoking 4.5 months ago, although his wife still smokes.  His previous angina had a pattern of "burning" in the retrosternal area. 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  Past Medical History:  Diagnosis Date  . 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 Hyperlipidemia LDL goal < 70 08/04/2013  . Hypertension   . STEMI (ST elevation myocardial infarction) (HCC) 08/02/13  . Tobacco abuse 08/04/2013    Past Surgical History:  Procedure Laterality Date  . CORONARY ANGIOPLASTY WITH STENT PLACEMENT  08/2013   Xience DES to [facility] for STEMI  . LEFT HEART CATHETERIZATION WITH CORONARY ANGIOGRAM N/A 08/02/2013   Procedure: LEFT HEART CATHETERIZATION WITH CORONARY ANGIOGRAM;  Surgeon: Lennette Bihari, MD;  Location: [facility] CATH LAB;  Service: Cardiovascular;  Laterality: N/A;    . PERCUTANEOUS CORONARY STENT INTERVENTION (PCI-S)  08/02/2013   Procedure: PERCUTANEOUS CORONARY STENT INTERVENTION (PCI-S);  Surgeon: Lennette Bihari, MD;  Location: [facility] CATH LAB;  Service: Cardiovascular;;  . TRANSFORAMINAL LUMBAR INTERBODY FUSION (TLIF) WITH PEDICLE SCREW FIXATION 2 LEVEL Left 08/23/2019   Procedure: Left Lumbar 4-5 Lumbar 5-Sacral 1 Transforaminal lumbar interbody fusion;  Surgeon: Maeola Harman, MD;  Location: [facility] OR;  Service: Neurosurgery;  Laterality: Left;  3c    Current Medications: Outpatient Medications Prior to Visit  Medication Sig Dispense Refill  . aspirin EC 81 MG EC tablet Take 1 tablet (81 mg total) by mouth daily.    [name] atorvastatin (LIPITOR) 80 MG tablet TAKE 1 TABLET (80 MG TOTAL) BY MOUTH DAILY AT 6 PM. (Patient taking differently: Take 80 mg by mouth daily. ) 30 tablet 7  . donepezil (ARICEPT) 10 MG tablet Take 10 mg by mouth daily.    [name] gabapentin (NEURONTIN) 400 MG capsule Take 400 mg by mouth 4 (four) times daily.     [name] HYDROcodone-acetaminophen (NORCO/VICODIN) 5-325 MG tablet Take 1-2 tablets by mouth every 4 (four) hours as needed for moderate pain. 30 tablet 0  . lisinopril (ZESTRIL) 20 MG tablet Take 20 mg by mouth daily.    . methocarbamol (ROBAXIN) 500 MG tablet Take 1 tablet (500 mg total) by mouth every 6 (six) hours as needed for muscle spasms. 60 tablet 1  . metoprolol tartrate (LOPRESSOR) 25 MG tablet TAKE 0.5 TABLETS (12.5 MG TOTAL) BY MOUTH 2 (TWO) TIMES DAILY. (Patient taking differently: Take 12.5 mg by mouth 2 (two) times daily. ) 30 tablet 7  .  Multiple Vitamins-Minerals (MULTIVITAMIN WITH MINERALS) tablet Take 1 tablet by mouth daily.    . nitroGLYCERIN (NITROSTAT) 0.4 MG SL tablet Place 0.4 mg under the tongue every 5 (five) minutes as needed for chest pain.    . CHANTIX 0.5 MG tablet Take 0.5 mg by mouth 2 (two) times daily.    . nitroGLYCERIN (NITROSTAT) 0.4 MG SL tablet Place 1 tablet (0.4 mg total) under the tongue every 5 (five)  minutes x 3 doses as needed for chest pain. 25 tablet 4  . VIAGRA 100 MG tablet Take 1 tablet by mouth as needed.     No facility-administered medications prior to visit.     Allergies:   Patient has no known allergies.   Social History   Socioeconomic History  . Marital status: Married    Spouse name: Not on file  . Number of children: Not on file  . Years of education: Not on file  . Highest education level: Not on file  Occupational History  . Not on file  Tobacco Use  . Smoking status: Former Smoker    Packs/day: 1.00    Years: 43.00    Pack years: 43.00    Types: Cigarettes    Start date: 03/03/1969    Quit date: 08/02/2013    Years since quitting: 6.2  . Smokeless tobacco: Never Used  Substance and Sexual Activity  . Alcohol use: Yes    Alcohol/week: 7.0 standard drinks    Types: 7 Cans of beer per week    Comment: 1 beer a night  . Drug use: No  . Sexual activity: Not on file  Other Topics Concern  . Not on file  Social History Narrative  . Not on file   Social Determinants of Health   Financial Resource Strain:   . Difficulty of Paying Living Expenses: Not on file  Food Insecurity:   . Worried About Programme researcher, broadcasting/film/video in the Last Year: Not on file  . Ran Out of Food in the Last Year: Not on file  Transportation Needs:   . Lack of Transportation (Medical): Not on file  . Lack of Transportation (Non-Medical): Not on file  Physical Activity:   . Days of Exercise per Week: Not on file  . Minutes of Exercise per Session: Not on file  Stress:   . Feeling of Stress : Not on file  Social Connections:   . Frequency of Communication with Friends and Family: Not on file  . Frequency of Social Gatherings with Friends and Family: Not on file  . Attends Religious Services: Not on file  . Active Member of Clubs or Organizations: Not on file  . Attends Banker Meetings: Not on file  . Marital Status: Not on file     Family History:  The patient's  family history includes AAA (abdominal aortic aneurysm) in his father; Cancer in his mother; Healthy in his brother, brother, brother, brother, brother, brother, brother, brother, sister, sister, sister, sister, and sister.   ROS:   Please see the history of present illness.    ROS  All other systems are reviewed and are negative.   PHYSICAL EXAM:   VS:  BP 129/83   Pulse 64   Ht 5\' 7"  (1.702 m)   Wt 147 lb 12.8 oz (67 kg)   SpO2 98%   BMI 23.15 kg/m     General: Alert, oriented x3, no distress, looks fit Head: no evidence of trauma, PERRL, EOMI, no exophtalmos  or lid lag, no myxedema, no xanthelasma; normal ears, nose and oropharynx Neck: normal jugular venous pulsations and no hepatojugular reflux; brisk carotid pulses without delay and no carotid bruits Chest: clear to auscultation, no signs of consolidation by percussion or palpation, normal fremitus, symmetrical and full respiratory excursions Cardiovascular: normal position and quality of the apical impulse, regular rhythm, normal first and second heart sounds, no murmurs, rubs or gallops Abdomen: no tenderness or distention, no masses by palpation, no abnormal pulsatility or arterial bruits, normal bowel sounds, no hepatosplenomegaly Extremities: no clubbing, cyanosis or edema; 2+ radial, ulnar and brachial pulses bilaterally; 2+ right femoral, posterior tibial and dorsalis pedis pulses; 2+ left femoral, posterior tibial and dorsalis pedis pulses; no subclavian or femoral bruits Neurological: grossly nonfocal Psych: Normal mood and affect   Wt Readings from Last 3 Encounters:  11/02/19 147 lb 12.8 oz (67 kg)  08/23/19 151 lb 10.8 oz (68.8 kg)  08/19/19 151 lb 10.8 oz (68.8 kg)    Studies/Labs Reviewed:   EKG:  EKG is ordered today.  The ekg ordered today shows two P wave morphologies (one ectopic atrial rhythm transitioning to sinus bradycardia, otherwise normal) Recent Labs: 08/19/2019: BUN 26; Creatinine, Ser 1.12;  Hemoglobin 13.9; Platelets 225; Potassium 4.3; Sodium 136   Lipid Panel    Component Value Date/Time   CHOL 96 (L) 11/02/2019 1029   TRIG 35 11/02/2019 1029   HDL 52 11/02/2019 1029   CHOLHDL 1.8 11/02/2019 1029   CHOLHDL 4.2 08/03/2013 0123   VLDL 30 08/03/2013 0123   LDLCALC 34 11/02/2019 1029   10/05/2018 Chol 127, HDL 50, LDL 53, TG 118  08/19/2019 Creat 1.12, K 4.3, Hgb 13.9  ASSESSMENT:    1. Coronary artery disease involving native coronary artery of native heart without angina pectoris   2. Hypercholesterolemia   3. Essential hypertension      PLAN:  In order of problems listed above:  1. CAD: Active, angina free, 6 years after non-ST segment elevation MI and placement of drug-eluting stents in the right coronary artery. Congratulated on not smoking. 2. HLP: On high-dose statin, labs were OK in 2020, time for recheck. Target LDL preferably less than 70. 3. HTN: mildly elevated diastolic BP, otw acceptable. No changes made.    Medication Adjustments/Labs and Tests Ordered: Current medicines are reviewed at length with the patient today.  Concerns regarding medicines are outlined above.  Medication changes, Labs and Tests ordered today are listed in the Patient Instructions below. Patient Instructions  Medication Instructions:  No changes *If you need a refill on your cardiac medications before your next appointment, please call your pharmacy*   Lab Work: Your provider would like for you to have the following labs today: Lipid  If you have labs (blood work) drawn today and your tests are completely normal, you will receive your results only by: [name] MyChart Message (if you have MyChart) OR . A paper copy in the mail If you have any lab test that is abnormal or we need to change your treatment, we will call you to review the results.   Testing/Procedures: None ordered   Follow-Up: At [facility], you and your health needs are our priority.  As part of  our continuing mission to provide you with exceptional heart care, we have created designated Provider Care Teams.  These Care Teams include your primary Cardiologist (physician) and Advanced Practice Providers (APPs -  Physician Assistants and Nurse Practitioners) who all work together to provide you with  the care you need, when you need it.  We recommend signing up for the patient portal called "MyChart".  Sign up information is provided on this After Visit Summary.  MyChart is used to connect with patients for Virtual Visits (Telemedicine).  Patients are able to view lab/test results, encounter notes, upcoming appointments, etc.  Non-urgent messages can be sent to your provider as well.   To learn more about what you can do with MyChart, go to [email or web address].    Your next appointment:   12 month(s)  The format for your next appointment:   In Person  Provider:   You may see [name], MD or one of the following Advanced Practice Providers on your designated Care Team:    Azalee Course, PA-C  Micah Flesher, [place] or   Judy Pimple, PA-C       Signed, [name], MD  11/03/2019 2:07 PM    [facility] [address] [place], [place], [place]  [address] Phone: [phone]; Fax: [phone]

## 2019-11-03 ENCOUNTER — Encounter: Payer: Self-pay | Admitting: Cardiovascular Disease

## 2019-11-04 ENCOUNTER — Encounter: Payer: Self-pay | Admitting: *Deleted

## 2019-11-10 DIAGNOSIS — F0150 Vascular dementia without behavioral disturbance: Secondary | ICD-10-CM | POA: Diagnosis not present

## 2019-11-10 DIAGNOSIS — I10 Essential (primary) hypertension: Secondary | ICD-10-CM | POA: Diagnosis not present

## 2019-11-10 DIAGNOSIS — Z125 Encounter for screening for malignant neoplasm of prostate: Secondary | ICD-10-CM | POA: Diagnosis not present

## 2019-11-10 DIAGNOSIS — Z6823 Body mass index (BMI) 23.0-23.9, adult: Secondary | ICD-10-CM | POA: Diagnosis not present

## 2019-11-10 DIAGNOSIS — E7849 Other hyperlipidemia: Secondary | ICD-10-CM | POA: Diagnosis not present

## 2019-11-14 DIAGNOSIS — M5416 Radiculopathy, lumbar region: Secondary | ICD-10-CM | POA: Diagnosis not present

## 2020-01-23 DIAGNOSIS — H6242 Otitis externa in other diseases classified elsewhere, left ear: Secondary | ICD-10-CM | POA: Diagnosis not present

## 2020-01-23 DIAGNOSIS — I10 Essential (primary) hypertension: Secondary | ICD-10-CM | POA: Diagnosis not present

## 2020-01-23 DIAGNOSIS — Z6823 Body mass index (BMI) 23.0-23.9, adult: Secondary | ICD-10-CM | POA: Diagnosis not present

## 2020-01-23 DIAGNOSIS — F0150 Vascular dementia without behavioral disturbance: Secondary | ICD-10-CM | POA: Diagnosis not present

## 2020-01-23 DIAGNOSIS — E7849 Other hyperlipidemia: Secondary | ICD-10-CM | POA: Diagnosis not present

## 2020-02-09 DIAGNOSIS — I10 Essential (primary) hypertension: Secondary | ICD-10-CM | POA: Diagnosis not present

## 2020-02-09 DIAGNOSIS — Z0000 Encounter for general adult medical examination without abnormal findings: Secondary | ICD-10-CM | POA: Diagnosis not present

## 2020-02-09 DIAGNOSIS — E7849 Other hyperlipidemia: Secondary | ICD-10-CM | POA: Diagnosis not present

## 2020-02-09 DIAGNOSIS — Z6823 Body mass index (BMI) 23.0-23.9, adult: Secondary | ICD-10-CM | POA: Diagnosis not present

## 2020-02-09 DIAGNOSIS — F0150 Vascular dementia without behavioral disturbance: Secondary | ICD-10-CM | POA: Diagnosis not present

## 2020-02-09 DIAGNOSIS — Z1331 Encounter for screening for depression: Secondary | ICD-10-CM | POA: Diagnosis not present

## 2020-02-13 DIAGNOSIS — M545 Low back pain, unspecified: Secondary | ICD-10-CM | POA: Diagnosis not present

## 2020-02-13 DIAGNOSIS — M5126 Other intervertebral disc displacement, lumbar region: Secondary | ICD-10-CM | POA: Diagnosis not present

## 2020-02-13 DIAGNOSIS — M48061 Spinal stenosis, lumbar region without neurogenic claudication: Secondary | ICD-10-CM | POA: Diagnosis not present

## 2020-02-13 DIAGNOSIS — M5416 Radiculopathy, lumbar region: Secondary | ICD-10-CM | POA: Diagnosis not present

## 2020-05-09 DIAGNOSIS — F0150 Vascular dementia without behavioral disturbance: Secondary | ICD-10-CM | POA: Diagnosis not present

## 2020-05-09 DIAGNOSIS — R55 Syncope and collapse: Secondary | ICD-10-CM | POA: Diagnosis not present

## 2020-05-09 DIAGNOSIS — Z6822 Body mass index (BMI) 22.0-22.9, adult: Secondary | ICD-10-CM | POA: Diagnosis not present

## 2020-05-09 DIAGNOSIS — I10 Essential (primary) hypertension: Secondary | ICD-10-CM | POA: Diagnosis not present

## 2020-05-09 DIAGNOSIS — Z1331 Encounter for screening for depression: Secondary | ICD-10-CM | POA: Diagnosis not present

## 2020-05-09 DIAGNOSIS — E7849 Other hyperlipidemia: Secondary | ICD-10-CM | POA: Diagnosis not present

## 2020-05-09 DIAGNOSIS — Z0000 Encounter for general adult medical examination without abnormal findings: Secondary | ICD-10-CM | POA: Diagnosis not present

## 2020-05-15 DIAGNOSIS — I519 Heart disease, unspecified: Secondary | ICD-10-CM | POA: Diagnosis not present

## 2020-05-15 DIAGNOSIS — R55 Syncope and collapse: Secondary | ICD-10-CM | POA: Diagnosis not present

## 2020-06-11 ENCOUNTER — Ambulatory Visit: Payer: PPO | Admitting: Cardiovascular Disease

## 2020-11-14 ENCOUNTER — Other Ambulatory Visit: Payer: Self-pay

## 2020-11-14 ENCOUNTER — Ambulatory Visit: Payer: Medicare HMO | Admitting: Cardiovascular Disease

## 2020-11-14 ENCOUNTER — Encounter: Payer: Self-pay | Admitting: Cardiovascular Disease

## 2020-11-14 VITALS — BP 150/81 | HR 55 | Ht 67.0 in | Wt 139.4 lb

## 2020-11-14 DIAGNOSIS — I10 Essential (primary) hypertension: Secondary | ICD-10-CM

## 2020-11-14 DIAGNOSIS — E7800 Pure hypercholesterolemia, unspecified: Secondary | ICD-10-CM

## 2020-11-14 DIAGNOSIS — I2510 Atherosclerotic heart disease of native coronary artery without angina pectoris: Secondary | ICD-10-CM | POA: Diagnosis not present

## 2020-11-14 LAB — COMPREHENSIVE METABOLIC PANEL
ALT: 31 IU/L (ref 0–44)
AST: 30 IU/L (ref 0–40)
Albumin/Globulin Ratio: 2.6 — ABNORMAL HIGH (ref 1.2–2.2)
Albumin: 4.6 g/dL (ref 3.7–4.7)
Alkaline Phosphatase: 73 IU/L (ref 44–121)
BUN/Creatinine Ratio: 21 (ref 10–24)
BUN: 18 mg/dL (ref 8–27)
Bilirubin Total: 0.4 mg/dL (ref 0.0–1.2)
CO2: 26 mmol/L (ref 20–29)
Calcium: 9.7 mg/dL (ref 8.6–10.2)
Chloride: 96 mmol/L (ref 96–106)
Creatinine, Ser: 0.86 mg/dL (ref 0.76–1.27)
Globulin, Total: 1.8 g/dL (ref 1.5–4.5)
Glucose: 90 mg/dL (ref 65–99)
Potassium: 5.4 mmol/L — ABNORMAL HIGH (ref 3.5–5.2)
Sodium: 133 mmol/L — ABNORMAL LOW (ref 134–144)
Total Protein: 6.4 g/dL (ref 6.0–8.5)
eGFR: 93 mL/min/{1.73_m2} (ref >59)

## 2020-11-14 LAB — LIPID PANEL
Chol/HDL Ratio: 2 ratio (ref 0.0–5.0)
Cholesterol, Total: 110 mg/dL (ref 100–199)
HDL: 54 mg/dL (ref >39)
LDL Chol Calc (NIH): 42 mg/dL (ref 0–99)
Triglycerides: 61 mg/dL (ref 0–149)
VLDL Cholesterol Cal: 14 mg/dL (ref 5–40)

## 2020-11-14 NOTE — Patient Instructions (Addendum)
Medication Instructions:  None ordered *If you need a refill on your cardiac medications before your next appointment, please call your pharmacy*   Lab Work: Your provider would like for you to have the following labs today: lipid and cmet  If you have labs (blood work) drawn today and your tests are completely normal, you will receive your results only by: MyChart Message (if you have MyChart) OR A paper copy in the mail If you have any lab test that is abnormal or we need to change your treatment, we will call you to review the results.   Testing/Procedures: None ordered   Follow-Up: At [facility], you and your health needs are our priority.  As part of our continuing mission to provide you with exceptional heart care, we have created designated Provider Care Teams.  These Care Teams include your primary Cardiologist (physician) and Advanced Practice Providers (APPs -  Physician Assistants and Nurse Practitioners) who all work together to provide you with the care you need, when you need it.  We recommend signing up for the patient portal called "MyChart".  Sign up information is provided on this After Visit Summary.  MyChart is used to connect with patients for Virtual Visits (Telemedicine).  Patients are able to view lab/test results, encounter notes, upcoming appointments, etc.  Non-urgent messages can be sent to your provider as well.   To learn more about what you can do with MyChart, go to [email or web address].    Your next appointment:   12 month(s)  The format for your next appointment:   In Person  Provider:   You may see [name], MD or one of the following Advanced Practice Providers on your designated Care Team:   Azalee Course, PA-C Micah Flesher, [place] or  Judy Pimple, [place]  Dr. Royann Shivers would like you to check your blood pressure daily for the next week.  Keep a journal of these daily blood pressure and heart rate readings and call our office or send a  message through MyChart with the results. Thank you!  It is best to check your BP 1-2 hours after taking your medications to see the medications effectiveness on your BP.    Here are some tips that our clinical pharmacists share for home BP monitoring:          Rest 10 minutes before taking your blood pressure.          Don't smoke or drink caffeinated beverages for at least 30 minutes before.          Take your blood pressure before (not after) you eat.          Sit comfortably with your back supported and both feet on the floor (don't cross your legs).          Elevate your arm to heart level on a table or a desk.          Use the proper sized cuff. It should fit smoothly and snugly around your bare upper arm. There should be enough room to slip a fingertip under the cuff. The bottom edge of the cuff should be 1 inch above the crease of the elbow.

## 2020-11-14 NOTE — Progress Notes (Signed)
Cardiology Office Note    Date:  11/14/2020   ID:  BREYDEN Williams, DOB 13-Jun-1950, MRN [id number]  PCP:  [name], MD  Cardiologist:   [name], MD   Chief Complaint  Patient presents with   Coronary Artery Disease     History of Present Illness:  Mark Williams is a 70 y.o. male with coronary artery disease presenting with non-ST segment elevation myocardial infarction and placement of drug-eluting stents (Xience 3.0x15 proximal, 2.5x15 distal ) to the right coronary artery in June 2015 (with residual 70% stenosis in a small ramus intermedius artery and scattered minor lesions in the LAD), hypertension, hyperlipidemia and ongoing smoking here for follow-up.   He is very active, very fit.  Has a large garden growing potatoes, onions, tomatoes and green beans.  Enjoys spending time with his 13 grandchildren.  The patient specifically denies any chest pain at rest exertion, dyspnea at rest or with exertion, orthopnea, paroxysmal nocturnal dyspnea, syncope, palpitations, focal neurological deficits, intermittent claudication, lower extremity edema, unexplained weight gain, cough, hemoptysis or wheezing.  He gets occasional dizziness if he stands up too quickly.  He gets occasional calf muscle cramps in bed at night.  He does not have exertional claudication.  He quit smoking last year, although his wife still smokes.  His previous angina had a pattern of "burning" in the retrosternal area. 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 08/02/13 08/04/2013   Hyperlipidemia LDL goal < 70 08/04/2013   Hypertension    STEMI (ST elevation myocardial infarction) (HCC) 08/02/13   Tobacco abuse 08/04/2013    Past Surgical History:  Procedure Laterality Date   CORONARY  ANGIOPLASTY WITH STENT PLACEMENT  08/2013   Xience DES to dRCA for STEMI   LEFT HEART CATHETERIZATION WITH CORONARY ANGIOGRAM N/A 08/02/2013   Procedure: LEFT HEART CATHETERIZATION WITH CORONARY ANGIOGRAM;  Surgeon: Lennette Bihari, MD;  Location: [facility] CATH LAB;  Service: Cardiovascular;  Laterality: N/A;   PERCUTANEOUS CORONARY STENT INTERVENTION (PCI-S)  08/02/2013   Procedure: PERCUTANEOUS CORONARY STENT INTERVENTION (PCI-S);  Surgeon: Lennette Bihari, MD;  Location: [facility] CATH LAB;  Service: Cardiovascular;;   TRANSFORAMINAL LUMBAR INTERBODY FUSION (TLIF) WITH PEDICLE SCREW FIXATION 2 LEVEL Left 08/23/2019   Procedure: Left Lumbar 4-5 Lumbar 5-Sacral 1 Transforaminal lumbar interbody fusion;  Surgeon: Maeola Harman, MD;  Location: [facility] OR;  Service: Neurosurgery;  Laterality: Left;  3c    Current Medications: Outpatient Medications Prior to Visit  Medication Sig Dispense Refill   aspirin EC 81 MG EC tablet Take 1 tablet (81 mg total) by mouth daily.     atorvastatin (LIPITOR) 80 MG tablet TAKE 1 TABLET (80 MG TOTAL) BY MOUTH DAILY AT 6 PM. (Patient taking differently: Take 80 mg by mouth daily.) 30 tablet 7   donepezil (ARICEPT) 10 MG tablet Take 10 mg by mouth daily.     gabapentin (NEURONTIN) 400 MG capsule Take 400 mg by mouth 4 (four) times daily.      HYDROcodone-acetaminophen (NORCO/VICODIN) 5-325 MG tablet Take 1-2 tablets by mouth every 4 (four) hours as needed for moderate pain. 30 tablet 0   lisinopril (ZESTRIL) 20 MG tablet Take 20 mg by mouth daily.     methocarbamol (ROBAXIN) 500 MG tablet Take 1  tablet (500 mg total) by mouth every 6 (six) hours as needed for muscle spasms. 60 tablet 1   metoprolol tartrate (LOPRESSOR) 25 MG tablet TAKE 0.5 TABLETS (12.5 MG TOTAL) BY MOUTH 2 (TWO) TIMES DAILY. (Patient taking differently: Take 12.5 mg by mouth 2 (two) times daily.) 30 tablet 7   Multiple Vitamins-Minerals (MULTIVITAMIN WITH MINERALS) tablet Take 1 tablet by mouth daily.     nitroGLYCERIN  (NITROSTAT) 0.4 MG SL tablet Place 0.4 mg under the tongue every 5 (five) minutes as needed for chest pain.     No facility-administered medications prior to visit.     Allergies:   Patient has no known allergies.   Social History   Socioeconomic History   Marital status: Married    Spouse name: Not on file   Number of children: Not on file   Years of education: Not on file   Highest education level: Not on file  Occupational History   Not on file  Tobacco Use   Smoking status: Former    Packs/day: 1.00    Years: 43.00    Pack years: 43.00    Types: Cigarettes    Start date: 03/03/1969    Quit date: 08/02/2013    Years since quitting: 7.2   Smokeless tobacco: Never  Substance and Sexual Activity   Alcohol use: Yes    Alcohol/week: 7.0 standard drinks    Types: 7 Cans of beer per week    Comment: 1 beer a night   Drug use: No   Sexual activity: Not on file  Other Topics Concern   Not on file  Social History Narrative   Not on file   Social Determinants of Health   Financial Resource Strain: Not on file  Food Insecurity: Not on file  Transportation Needs: Not on file  Physical Activity: Not on file  Stress: Not on file  Social Connections: Not on file     Family History:  The patient's family history includes AAA (abdominal aortic aneurysm) in his father; Cancer in his mother; Healthy in his brother, brother, brother, brother, brother, brother, brother, brother, sister, sister, sister, sister, and sister.   ROS:   Please see the history of present illness.    ROS  All other systems are reviewed and are negative.   PHYSICAL EXAM:   VS:  BP (!) 150/81 (BP Location: Left Arm, Patient Position: Sitting, Cuff Size: Normal)   Pulse (!) 55   Ht 5\' 7"  (1.702 m)   Wt 139 lb 6.4 oz (63.2 kg)   SpO2 100%   BMI 21.83 kg/m      General: Alert, oriented x3, no distress, lean and fit Head: no evidence of trauma, PERRL, EOMI, no exophtalmos or lid lag, no myxedema, no  xanthelasma; normal ears, nose and oropharynx Neck: normal jugular venous pulsations and no hepatojugular reflux; brisk carotid pulses without delay and no carotid bruits Chest: clear to auscultation, no signs of consolidation by percussion or palpation, normal fremitus, symmetrical and full respiratory excursions Cardiovascular: normal position and quality of the apical impulse, regular rhythm, normal first and second heart sounds, no murmurs, rubs or gallops Abdomen: no tenderness or distention, no masses by palpation, no abnormal pulsatility or arterial bruits, normal bowel sounds, no hepatosplenomegaly Extremities: no clubbing, cyanosis or edema; 2+ radial, ulnar and brachial pulses bilaterally; 2+ right femoral, posterior tibial and dorsalis pedis pulses; 2+ left femoral, posterior tibial and dorsalis pedis pulses; no subclavian or femoral bruits Neurological: grossly nonfocal Psych:  Normal mood and affect    Wt Readings from Last 3 Encounters:  11/14/20 139 lb 6.4 oz (63.2 kg)  11/02/19 147 lb 12.8 oz (67 kg)  08/23/19 151 lb 10.8 oz (68.8 kg)    Studies/Labs Reviewed:   EKG:  EKG is ordered today.  It shows sinus bradycardia and is otherwise a completely normal tracing  Recent Labs: No results found for requested labs within last 8760 hours.   Lipid Panel    Component Value Date/Time   CHOL 96 (L) 11/02/2019 1029   TRIG 35 11/02/2019 1029   HDL 52 11/02/2019 1029   CHOLHDL 1.8 11/02/2019 1029   CHOLHDL 4.2 08/03/2013 0123   VLDL 30 08/03/2013 0123   LDLCALC 34 11/02/2019 1029   10/05/2018 Chol 127, HDL 50, LDL 53, TG 118  08/19/2019 Creat 1.12, K 4.3, Hgb 13.9  ASSESSMENT:    1. Coronary artery disease involving native coronary artery of native heart without angina pectoris   2. Hypercholesterolemia   3. Essential hypertension      PLAN:  In order of problems listed above:  CAD: He is very active and does not have angina either at rest or with activity, 6  after his original presentation with non-STEMI and drug-eluting stents to the right coronary artery.  All risk factors are appropriately addressed. HLP: Great lipid profile last year.  Time to recheck. HTN: His systolic blood pressure is a little high today, even after he had about 10 minutes to relax.  However at home he reports that his systolic blood pressure is usually around 130.  No changes were made to his medications today, asked him to send me a log of his blood pressure from home in about a week's time.    Medication Adjustments/Labs and Tests Ordered: Current medicines are reviewed at length with the patient today.  Concerns regarding medicines are outlined above.  Medication changes, Labs and Tests ordered today are listed in the Patient Instructions below. Patient Instructions  Medication Instructions:  None ordered *If you need a refill on your cardiac medications before your next appointment, please call your pharmacy*   Lab Work: Your provider would like for you to have the following labs today: lipid and cmet  If you have labs (blood work) drawn today and your tests are completely normal, you will receive your results only by: MyChart Message (if you have MyChart) OR A paper copy in the mail If you have any lab test that is abnormal or we need to change your treatment, we will call you to review the results.   Testing/Procedures: None ordered   Follow-Up: At [facility], you and your health needs are our priority.  As part of our continuing mission to provide you with exceptional heart care, we have created designated Provider Care Teams.  These Care Teams include your primary Cardiologist (physician) and Advanced Practice Providers (APPs -  Physician Assistants and Nurse Practitioners) who all work together to provide you with the care you need, when you need it.  We recommend signing up for the patient portal called "MyChart".  Sign up information is provided on  this After Visit Summary.  MyChart is used to connect with patients for Virtual Visits (Telemedicine).  Patients are able to view lab/test results, encounter notes, upcoming appointments, etc.  Non-urgent messages can be sent to your provider as well.   To learn more about what you can do with MyChart, go to [email or web address].    Your next appointment:  12 month(s)  The format for your next appointment:   In Person  Provider:   You may see [name], MD or one of the following Advanced Practice Providers on your designated Care Team:   Azalee Course, PA-C Micah Flesher, [place] or  Judy Pimple, [place]  Dr. Royann Shivers would like you to check your blood pressure daily for the next week.  Keep a journal of these daily blood pressure and heart rate readings and call our office or send a message through MyChart with the results. Thank you!  It is best to check your BP 1-2 hours after taking your medications to see the medications effectiveness on your BP.    Here are some tips that our clinical pharmacists share for home BP monitoring:          Rest 10 minutes before taking your blood pressure.          Don't smoke or drink caffeinated beverages for at least 30 minutes before.          Take your blood pressure before (not after) you eat.          Sit comfortably with your back supported and both feet on the floor (don't cross your legs).          Elevate your arm to heart level on a table or a desk.          Use the proper sized cuff. It should fit smoothly and snugly around your bare upper arm. There should be enough room to slip a fingertip under the cuff. The bottom edge of the cuff should be 1 inch above the crease of the elbow.    Signed, [name], MD  11/14/2020 8:22 AM    [facility] [address] [place], [place], [place]  [address] Phone: [phone]; Fax: [phone]

## 2020-11-15 ENCOUNTER — Encounter: Payer: Self-pay | Admitting: *Deleted

## 2020-11-30 DIAGNOSIS — E7849 Other hyperlipidemia: Secondary | ICD-10-CM | POA: Diagnosis not present

## 2020-11-30 DIAGNOSIS — I10 Essential (primary) hypertension: Secondary | ICD-10-CM | POA: Diagnosis not present

## 2020-11-30 DIAGNOSIS — F0150 Vascular dementia without behavioral disturbance: Secondary | ICD-10-CM | POA: Diagnosis not present

## 2020-12-31 DIAGNOSIS — E7849 Other hyperlipidemia: Secondary | ICD-10-CM | POA: Diagnosis not present

## 2020-12-31 DIAGNOSIS — Z6822 Body mass index (BMI) 22.0-22.9, adult: Secondary | ICD-10-CM | POA: Diagnosis not present

## 2020-12-31 DIAGNOSIS — I10 Essential (primary) hypertension: Secondary | ICD-10-CM | POA: Diagnosis not present

## 2021-04-08 ENCOUNTER — Encounter: Payer: Self-pay | Admitting: *Deleted

## 2021-06-03 ENCOUNTER — Ambulatory Visit: Payer: Medicare HMO

## 2021-07-31 ENCOUNTER — Encounter: Payer: Self-pay | Admitting: *Deleted

## 2022-01-06 ENCOUNTER — Encounter: Payer: Self-pay | Admitting: *Deleted

## 2022-08-17 ENCOUNTER — Encounter (HOSPITAL_COMMUNITY): Payer: Self-pay

## 2022-08-17 ENCOUNTER — Other Ambulatory Visit: Payer: Self-pay

## 2022-08-17 DIAGNOSIS — J029 Acute pharyngitis, unspecified: Secondary | ICD-10-CM | POA: Insufficient documentation

## 2022-08-17 DIAGNOSIS — Z5321 Procedure and treatment not carried out due to patient leaving prior to being seen by health care provider: Secondary | ICD-10-CM | POA: Diagnosis not present

## 2022-08-17 DIAGNOSIS — F17200 Nicotine dependence, unspecified, uncomplicated: Secondary | ICD-10-CM | POA: Insufficient documentation

## 2022-08-17 DIAGNOSIS — Z20822 Contact with and (suspected) exposure to covid-19: Secondary | ICD-10-CM | POA: Insufficient documentation

## 2022-08-17 LAB — RESP PANEL BY RT-PCR (RSV, FLU A&B, COVID)  RVPGX2
Influenza A by PCR: NEGATIVE
Influenza B by PCR: NEGATIVE
Resp Syncytial Virus by PCR: NEGATIVE
SARS Coronavirus 2 by RT PCR: NEGATIVE

## 2022-08-17 NOTE — ED Triage Notes (Signed)
Pt presents with sore throat, cough, eye drainage x2 days. Pt denies any fevers. Pt is very congested and is a smoker.

## 2022-08-18 ENCOUNTER — Emergency Department (HOSPITAL_COMMUNITY)
Admission: EM | Admit: 2022-08-18 | Discharge: 2022-08-18 | Discharge status: Against Medical Advice | Payer: Medicare Other | Attending: Emergency Medicine | Admitting: Emergency Medicine

## 2022-08-20 DIAGNOSIS — J40 Bronchitis, not specified as acute or chronic: Secondary | ICD-10-CM | POA: Diagnosis not present

## 2022-08-20 DIAGNOSIS — Z6822 Body mass index (BMI) 22.0-22.9, adult: Secondary | ICD-10-CM | POA: Diagnosis not present

## 2022-08-20 DIAGNOSIS — Z0000 Encounter for general adult medical examination without abnormal findings: Secondary | ICD-10-CM | POA: Diagnosis not present

## 2022-09-10 NOTE — Progress Notes (Signed)
No further comment.

## 2023-01-27 DIAGNOSIS — I10 Essential (primary) hypertension: Secondary | ICD-10-CM | POA: Diagnosis not present

## 2023-01-27 DIAGNOSIS — E7849 Other hyperlipidemia: Secondary | ICD-10-CM | POA: Diagnosis not present

## 2023-01-27 DIAGNOSIS — J40 Bronchitis, not specified as acute or chronic: Secondary | ICD-10-CM | POA: Diagnosis not present

## 2023-01-27 DIAGNOSIS — M5459 Other low back pain: Secondary | ICD-10-CM | POA: Diagnosis not present

## 2023-01-27 DIAGNOSIS — Z0000 Encounter for general adult medical examination without abnormal findings: Secondary | ICD-10-CM | POA: Diagnosis not present

## 2023-01-27 DIAGNOSIS — Z6822 Body mass index (BMI) 22.0-22.9, adult: Secondary | ICD-10-CM | POA: Diagnosis not present

## 2023-04-28 DIAGNOSIS — E7849 Other hyperlipidemia: Secondary | ICD-10-CM | POA: Diagnosis not present

## 2023-04-28 DIAGNOSIS — Z0000 Encounter for general adult medical examination without abnormal findings: Secondary | ICD-10-CM | POA: Diagnosis not present

## 2023-04-28 DIAGNOSIS — Z6822 Body mass index (BMI) 22.0-22.9, adult: Secondary | ICD-10-CM | POA: Diagnosis not present

## 2023-04-28 DIAGNOSIS — I10 Essential (primary) hypertension: Secondary | ICD-10-CM | POA: Diagnosis not present

## 2023-04-28 DIAGNOSIS — M5459 Other low back pain: Secondary | ICD-10-CM | POA: Diagnosis not present

## 2023-06-29 DIAGNOSIS — H6121 Impacted cerumen, right ear: Secondary | ICD-10-CM | POA: Diagnosis not present

## 2023-06-29 DIAGNOSIS — I10 Essential (primary) hypertension: Secondary | ICD-10-CM | POA: Diagnosis not present

## 2023-06-29 DIAGNOSIS — Z6822 Body mass index (BMI) 22.0-22.9, adult: Secondary | ICD-10-CM | POA: Diagnosis not present

## 2023-07-30 DIAGNOSIS — Z0000 Encounter for general adult medical examination without abnormal findings: Secondary | ICD-10-CM | POA: Diagnosis not present

## 2023-07-30 DIAGNOSIS — Z6822 Body mass index (BMI) 22.0-22.9, adult: Secondary | ICD-10-CM | POA: Diagnosis not present

## 2023-07-30 DIAGNOSIS — E7849 Other hyperlipidemia: Secondary | ICD-10-CM | POA: Diagnosis not present

## 2023-07-30 DIAGNOSIS — I10 Essential (primary) hypertension: Secondary | ICD-10-CM | POA: Diagnosis not present

## 2023-07-30 DIAGNOSIS — H6121 Impacted cerumen, right ear: Secondary | ICD-10-CM | POA: Diagnosis not present

## 2023-08-04 ENCOUNTER — Encounter (INDEPENDENT_AMBULATORY_CARE_PROVIDER_SITE_OTHER): Payer: Self-pay | Admitting: *Deleted

## 2023-11-30 DIAGNOSIS — I10 Essential (primary) hypertension: Secondary | ICD-10-CM | POA: Diagnosis not present

## 2023-11-30 DIAGNOSIS — E7849 Other hyperlipidemia: Secondary | ICD-10-CM | POA: Diagnosis not present

## 2023-12-08 DIAGNOSIS — Z6822 Body mass index (BMI) 22.0-22.9, adult: Secondary | ICD-10-CM | POA: Diagnosis not present

## 2023-12-08 DIAGNOSIS — N182 Chronic kidney disease, stage 2 (mild): Secondary | ICD-10-CM | POA: Diagnosis not present

## 2023-12-08 DIAGNOSIS — E7849 Other hyperlipidemia: Secondary | ICD-10-CM | POA: Diagnosis not present

## 2023-12-08 DIAGNOSIS — I10 Essential (primary) hypertension: Secondary | ICD-10-CM | POA: Diagnosis not present

## 2024-02-10 ENCOUNTER — Encounter (INDEPENDENT_AMBULATORY_CARE_PROVIDER_SITE_OTHER): Payer: Self-pay | Admitting: *Deleted
